# Patient Record
Sex: Female | Born: 1945 | Race: White | Hispanic: No | Marital: Married | Smoking: Never smoker
Health system: Southern US, Community
[De-identification: ages and names within clinical notes are randomized; demographics above are authoritative.]

## PROBLEM LIST (undated history)

## (undated) DIAGNOSIS — C801 Malignant (primary) neoplasm, unspecified: Secondary | ICD-10-CM

## (undated) DIAGNOSIS — R3915 Urgency of urination: Secondary | ICD-10-CM

## (undated) DIAGNOSIS — C50919 Malignant neoplasm of unspecified site of unspecified female breast: Secondary | ICD-10-CM

## (undated) DIAGNOSIS — E7800 Pure hypercholesterolemia, unspecified: Secondary | ICD-10-CM

## (undated) DIAGNOSIS — N939 Abnormal uterine and vaginal bleeding, unspecified: Secondary | ICD-10-CM

## (undated) DIAGNOSIS — I10 Essential (primary) hypertension: Secondary | ICD-10-CM

## (undated) DIAGNOSIS — Z923 Personal history of irradiation: Secondary | ICD-10-CM

## (undated) HISTORY — PX: BREAST CYST ASPIRATION: SHX578

## (undated) HISTORY — PX: TUBAL LIGATION: SHX77

## (undated) HISTORY — PX: DILATION AND CURETTAGE OF UTERUS: SHX78

---

## 2004-10-25 ENCOUNTER — Ambulatory Visit: Payer: Self-pay | Admitting: Internal Medicine

## 2005-06-29 ENCOUNTER — Ambulatory Visit: Payer: Self-pay | Admitting: Unknown Physician Specialty

## 2006-01-25 ENCOUNTER — Ambulatory Visit: Payer: Self-pay | Admitting: Internal Medicine

## 2007-02-28 ENCOUNTER — Ambulatory Visit: Payer: Self-pay | Admitting: Internal Medicine

## 2007-03-07 ENCOUNTER — Ambulatory Visit: Payer: Self-pay | Admitting: Internal Medicine

## 2007-06-26 DIAGNOSIS — C4491 Basal cell carcinoma of skin, unspecified: Secondary | ICD-10-CM

## 2007-06-26 HISTORY — DX: Basal cell carcinoma of skin, unspecified: C44.91

## 2007-09-09 ENCOUNTER — Ambulatory Visit: Payer: Self-pay | Admitting: Internal Medicine

## 2007-09-12 ENCOUNTER — Ambulatory Visit: Payer: Self-pay | Admitting: Internal Medicine

## 2007-09-18 ENCOUNTER — Ambulatory Visit: Payer: Self-pay | Admitting: Internal Medicine

## 2008-04-01 ENCOUNTER — Ambulatory Visit: Payer: Self-pay | Admitting: General Surgery

## 2008-04-08 ENCOUNTER — Ambulatory Visit: Payer: Self-pay | Admitting: General Surgery

## 2008-05-06 ENCOUNTER — Ambulatory Visit: Payer: Self-pay | Admitting: Surgery

## 2008-05-28 ENCOUNTER — Ambulatory Visit: Payer: Self-pay | Admitting: Surgery

## 2008-05-28 ENCOUNTER — Ambulatory Visit: Payer: Self-pay | Admitting: Cardiovascular Disease

## 2008-05-29 ENCOUNTER — Ambulatory Visit: Payer: Self-pay | Admitting: Oncology

## 2008-05-29 DIAGNOSIS — C50919 Malignant neoplasm of unspecified site of unspecified female breast: Secondary | ICD-10-CM

## 2008-05-29 HISTORY — DX: Malignant neoplasm of unspecified site of unspecified female breast: C50.919

## 2008-05-29 HISTORY — PX: BREAST LUMPECTOMY: SHX2

## 2008-05-29 HISTORY — PX: BREAST LUMPECTOMY WITH AXILLARY LYMPH NODE BIOPSY AND MAMMOSITE CAVITY EVALUATION DEVICE: SHX5594

## 2008-06-02 ENCOUNTER — Ambulatory Visit: Payer: Self-pay | Admitting: Surgery

## 2008-06-17 ENCOUNTER — Ambulatory Visit: Payer: Self-pay | Admitting: Oncology

## 2008-06-29 ENCOUNTER — Ambulatory Visit: Payer: Self-pay | Admitting: Oncology

## 2008-07-02 ENCOUNTER — Ambulatory Visit: Payer: Self-pay | Admitting: Surgery

## 2008-07-27 ENCOUNTER — Ambulatory Visit: Payer: Self-pay | Admitting: Oncology

## 2008-08-27 ENCOUNTER — Ambulatory Visit: Payer: Self-pay | Admitting: Oncology

## 2008-09-26 ENCOUNTER — Ambulatory Visit: Payer: Self-pay | Admitting: Oncology

## 2008-09-30 ENCOUNTER — Ambulatory Visit: Payer: Self-pay | Admitting: Oncology

## 2008-10-09 ENCOUNTER — Encounter: Payer: Self-pay | Admitting: Oncology

## 2008-10-27 ENCOUNTER — Encounter: Payer: Self-pay | Admitting: Oncology

## 2008-10-27 ENCOUNTER — Ambulatory Visit: Payer: Self-pay | Admitting: Oncology

## 2008-11-26 ENCOUNTER — Encounter: Payer: Self-pay | Admitting: Oncology

## 2008-11-26 ENCOUNTER — Ambulatory Visit: Payer: Self-pay | Admitting: Radiation Oncology

## 2008-12-07 ENCOUNTER — Ambulatory Visit: Payer: Self-pay | Admitting: Oncology

## 2008-12-27 ENCOUNTER — Ambulatory Visit: Payer: Self-pay | Admitting: Radiation Oncology

## 2009-01-06 ENCOUNTER — Ambulatory Visit: Payer: Self-pay | Admitting: Oncology

## 2009-01-27 ENCOUNTER — Ambulatory Visit: Payer: Self-pay | Admitting: Oncology

## 2009-01-27 ENCOUNTER — Ambulatory Visit: Payer: Self-pay | Admitting: Radiation Oncology

## 2009-03-29 ENCOUNTER — Ambulatory Visit: Payer: Self-pay | Admitting: Oncology

## 2009-04-08 ENCOUNTER — Ambulatory Visit: Payer: Self-pay | Admitting: Oncology

## 2009-04-21 ENCOUNTER — Ambulatory Visit: Payer: Self-pay | Admitting: Surgery

## 2009-04-28 ENCOUNTER — Ambulatory Visit: Payer: Self-pay | Admitting: Oncology

## 2009-07-27 ENCOUNTER — Ambulatory Visit: Payer: Self-pay | Admitting: Oncology

## 2009-08-24 ENCOUNTER — Ambulatory Visit: Payer: Self-pay | Admitting: Oncology

## 2009-08-27 ENCOUNTER — Ambulatory Visit: Payer: Self-pay | Admitting: Oncology

## 2010-04-06 ENCOUNTER — Ambulatory Visit: Payer: Self-pay | Admitting: Oncology

## 2010-04-25 ENCOUNTER — Ambulatory Visit: Payer: Self-pay | Admitting: Surgery

## 2010-04-28 ENCOUNTER — Ambulatory Visit: Payer: Self-pay | Admitting: Oncology

## 2010-05-29 ENCOUNTER — Ambulatory Visit: Payer: Self-pay | Admitting: Oncology

## 2010-10-05 ENCOUNTER — Ambulatory Visit: Payer: Self-pay | Admitting: Oncology

## 2010-10-06 LAB — CANCER ANTIGEN 27.29: CA 27.29: 37.1 U/mL (ref 0.0–38.6)

## 2010-10-28 ENCOUNTER — Ambulatory Visit: Payer: Self-pay | Admitting: Oncology

## 2011-04-28 ENCOUNTER — Ambulatory Visit: Payer: Self-pay | Admitting: Oncology

## 2011-04-29 ENCOUNTER — Ambulatory Visit: Payer: Self-pay | Admitting: Oncology

## 2011-04-29 LAB — CANCER ANTIGEN 27.29: CA 27.29: 25.9 U/mL (ref 0.0–38.6)

## 2011-05-03 ENCOUNTER — Ambulatory Visit: Payer: Self-pay | Admitting: Surgery

## 2011-11-17 ENCOUNTER — Ambulatory Visit: Payer: Self-pay | Admitting: Oncology

## 2011-11-17 LAB — COMPREHENSIVE METABOLIC PANEL
Albumin: 3.9 g/dL (ref 3.4–5.0)
Anion Gap: 8 (ref 7–16)
Bilirubin,Total: 0.4 mg/dL (ref 0.2–1.0)
Calcium, Total: 10.3 mg/dL — ABNORMAL HIGH (ref 8.5–10.1)
Chloride: 104 mmol/L (ref 98–107)
Co2: 29 mmol/L (ref 21–32)
Creatinine: 0.78 mg/dL (ref 0.60–1.30)
EGFR (African American): >60
Glucose: 96 mg/dL (ref 65–99)
SGPT (ALT): 31 U/L

## 2011-11-17 LAB — CBC CANCER CENTER
Basophil #: 0 x10 3/mm (ref 0.0–0.1)
Basophil %: 0.8 %
HGB: 13.1 g/dL (ref 12.0–16.0)
MCHC: 34 g/dL (ref 32.0–36.0)
MCV: 90 fL (ref 80–100)
Monocyte #: 0.5 x10 3/mm (ref 0.2–0.9)
Monocyte %: 9.8 %
Neutrophil %: 47.6 %
Platelet: 229 x10 3/mm (ref 150–440)
RBC: 4.27 10*6/uL (ref 3.80–5.20)
WBC: 5.5 x10 3/mm (ref 3.6–11.0)

## 2011-11-18 LAB — CANCER ANTIGEN 27.29: CA 27.29: 31.6 U/mL (ref 0.0–38.6)

## 2011-11-27 ENCOUNTER — Ambulatory Visit: Payer: Self-pay | Admitting: Oncology

## 2012-05-16 ENCOUNTER — Ambulatory Visit: Payer: Self-pay | Admitting: Surgery

## 2012-05-17 ENCOUNTER — Ambulatory Visit: Payer: Self-pay | Admitting: Oncology

## 2012-05-17 LAB — CBC CANCER CENTER
Basophil %: 0.6 %
Eosinophil %: 2.5 %
Lymphocyte #: 1.8 x10 3/mm (ref 1.0–3.6)
Lymphocyte %: 33.2 %
MCH: 29.8 pg (ref 26.0–34.0)
MCV: 88 fL (ref 80–100)
Monocyte #: 0.5 x10 3/mm (ref 0.2–0.9)
Monocyte %: 10 %
Neutrophil #: 3 x10 3/mm (ref 1.4–6.5)
Platelet: 231 x10 3/mm (ref 150–440)
RBC: 4.28 10*6/uL (ref 3.80–5.20)
WBC: 5.5 x10 3/mm (ref 3.6–11.0)

## 2012-05-17 LAB — COMPREHENSIVE METABOLIC PANEL
Albumin: 3.8 g/dL (ref 3.4–5.0)
Alkaline Phosphatase: 72 U/L (ref 50–136)
Anion Gap: 8 (ref 7–16)
Bilirubin,Total: 0.4 mg/dL (ref 0.2–1.0)
Calcium, Total: 10 mg/dL (ref 8.5–10.1)
Co2: 32 mmol/L (ref 21–32)
Glucose: 95 mg/dL (ref 65–99)
Osmolality: 287 (ref 275–301)
SGOT(AST): 29 U/L (ref 15–37)
SGPT (ALT): 33 U/L (ref 12–78)
Sodium: 144 mmol/L (ref 136–145)

## 2012-05-20 LAB — CANCER ANTIGEN 27.29: CA 27.29: 33.8 U/mL (ref 0.0–38.6)

## 2012-05-28 ENCOUNTER — Ambulatory Visit: Payer: Self-pay | Admitting: Surgery

## 2012-05-29 ENCOUNTER — Ambulatory Visit: Payer: Self-pay | Admitting: Oncology

## 2012-12-20 ENCOUNTER — Ambulatory Visit: Payer: Self-pay | Admitting: Oncology

## 2012-12-20 LAB — COMPREHENSIVE METABOLIC PANEL
Albumin: 4 g/dL (ref 3.4–5.0)
Anion Gap: 8 (ref 7–16)
Bilirubin,Total: 0.4 mg/dL (ref 0.2–1.0)
Chloride: 106 mmol/L (ref 98–107)
EGFR (Non-African Amer.): >60
Glucose: 92 mg/dL (ref 65–99)
Osmolality: 288 (ref 275–301)
SGOT(AST): 27 U/L (ref 15–37)
Sodium: 145 mmol/L (ref 136–145)

## 2012-12-20 LAB — CBC CANCER CENTER
Basophil #: 0 x10 3/mm (ref 0.0–0.1)
Basophil %: 0.7 %
HGB: 12.9 g/dL (ref 12.0–16.0)
Lymphocyte #: 2.1 x10 3/mm (ref 1.0–3.6)
Lymphocyte %: 39.1 %
MCH: 30.5 pg (ref 26.0–34.0)
MCHC: 34.4 g/dL (ref 32.0–36.0)
MCV: 88 fL (ref 80–100)
Monocyte #: 0.5 x10 3/mm (ref 0.2–0.9)
Neutrophil #: 2.6 x10 3/mm (ref 1.4–6.5)
Neutrophil %: 47.9 %
RBC: 4.25 10*6/uL (ref 3.80–5.20)
WBC: 5.5 x10 3/mm (ref 3.6–11.0)

## 2012-12-21 LAB — CANCER ANTIGEN 27.29: CA 27.29: 33.5 U/mL (ref 0.0–38.6)

## 2012-12-27 ENCOUNTER — Ambulatory Visit: Payer: Self-pay | Admitting: Oncology

## 2013-05-01 ENCOUNTER — Ambulatory Visit: Payer: Self-pay | Admitting: Oncology

## 2013-05-27 ENCOUNTER — Ambulatory Visit: Payer: Self-pay | Admitting: Internal Medicine

## 2013-07-25 ENCOUNTER — Ambulatory Visit: Payer: Self-pay | Admitting: Oncology

## 2013-07-25 LAB — CBC CANCER CENTER
Basophil #: 0 x10 3/mm (ref 0.0–0.1)
Basophil %: 0.7 %
EOS PCT: 2.1 %
Eosinophil #: 0.1 x10 3/mm (ref 0.0–0.7)
HCT: 40.4 % (ref 35.0–47.0)
HGB: 13.4 g/dL (ref 12.0–16.0)
LYMPHS ABS: 2 x10 3/mm (ref 1.0–3.6)
Lymphocyte %: 38.3 %
MCH: 29.5 pg (ref 26.0–34.0)
MCHC: 33.2 g/dL (ref 32.0–36.0)
MCV: 89 fL (ref 80–100)
MONOS PCT: 8 %
Monocyte #: 0.4 x10 3/mm (ref 0.2–0.9)
NEUTROS PCT: 50.9 %
Neutrophil #: 2.7 x10 3/mm (ref 1.4–6.5)
Platelet: 251 x10 3/mm (ref 150–440)
RBC: 4.56 10*6/uL (ref 3.80–5.20)
RDW: 13.8 % (ref 11.5–14.5)
WBC: 5.3 x10 3/mm (ref 3.6–11.0)

## 2013-07-25 LAB — COMPREHENSIVE METABOLIC PANEL
ALBUMIN: 3.8 g/dL (ref 3.4–5.0)
ALK PHOS: 87 U/L
AST: 22 U/L (ref 15–37)
Anion Gap: 10 (ref 7–16)
BILIRUBIN TOTAL: 0.3 mg/dL (ref 0.2–1.0)
BUN: 11 mg/dL (ref 7–18)
CO2: 30 mmol/L (ref 21–32)
Calcium, Total: 9.6 mg/dL (ref 8.5–10.1)
Chloride: 104 mmol/L (ref 98–107)
Creatinine: 0.78 mg/dL (ref 0.60–1.30)
EGFR (African American): >60
EGFR (Non-African Amer.): >60
Glucose: 97 mg/dL (ref 65–99)
OSMOLALITY: 286 (ref 275–301)
POTASSIUM: 4 mmol/L (ref 3.5–5.1)
SGPT (ALT): 30 U/L (ref 12–78)
Sodium: 144 mmol/L (ref 136–145)
Total Protein: 7.8 g/dL (ref 6.4–8.2)

## 2013-07-26 LAB — CANCER ANTIGEN 27.29: CA 27.29: 33.8 U/mL (ref 0.0–38.6)

## 2013-07-27 ENCOUNTER — Ambulatory Visit: Payer: Self-pay | Admitting: Oncology

## 2014-06-02 ENCOUNTER — Ambulatory Visit: Payer: Self-pay | Admitting: Oncology

## 2014-07-24 ENCOUNTER — Ambulatory Visit: Payer: Self-pay | Admitting: Oncology

## 2014-07-28 ENCOUNTER — Ambulatory Visit
Admit: 2014-07-28 | Disposition: A | Discharge status: Home / Self Care | Payer: Self-pay | Attending: Oncology | Admitting: Oncology

## 2015-06-23 ENCOUNTER — Other Ambulatory Visit: Payer: Self-pay | Admitting: Internal Medicine

## 2015-06-23 DIAGNOSIS — R319 Hematuria, unspecified: Secondary | ICD-10-CM

## 2015-06-23 DIAGNOSIS — Z853 Personal history of malignant neoplasm of breast: Secondary | ICD-10-CM

## 2015-06-23 DIAGNOSIS — Z1231 Encounter for screening mammogram for malignant neoplasm of breast: Secondary | ICD-10-CM

## 2015-06-29 ENCOUNTER — Ambulatory Visit
Admission: RE | Admit: 2015-06-29 | Discharge: 2015-06-29 | Disposition: A | Discharge status: Home / Self Care | Payer: Medicare Other | Source: Ambulatory Visit | Attending: Internal Medicine | Admitting: Internal Medicine

## 2015-06-29 ENCOUNTER — Ambulatory Visit: Payer: Self-pay

## 2015-06-29 DIAGNOSIS — D259 Leiomyoma of uterus, unspecified: Secondary | ICD-10-CM | POA: Diagnosis not present

## 2015-06-29 DIAGNOSIS — R319 Hematuria, unspecified: Secondary | ICD-10-CM | POA: Diagnosis present

## 2015-06-29 DIAGNOSIS — K8020 Calculus of gallbladder without cholecystitis without obstruction: Secondary | ICD-10-CM | POA: Diagnosis not present

## 2015-06-29 DIAGNOSIS — K5730 Diverticulosis of large intestine without perforation or abscess without bleeding: Secondary | ICD-10-CM | POA: Insufficient documentation

## 2015-06-29 HISTORY — DX: Essential (primary) hypertension: I10

## 2015-06-29 HISTORY — DX: Malignant (primary) neoplasm, unspecified: C80.1

## 2015-06-29 MED ORDER — IOHEXOL 300 MG/ML  SOLN
100.0000 mL | Freq: Once | INTRAMUSCULAR | Status: AC | PRN
  Administered 2015-06-29: 100 mL via INTRAVENOUS

## 2015-07-28 ENCOUNTER — Ambulatory Visit
Admission: RE | Admit: 2015-07-28 | Discharge: 2015-07-28 | Disposition: A | Discharge status: Home / Self Care | Payer: Medicare Other | Source: Ambulatory Visit | Attending: Internal Medicine | Admitting: Internal Medicine

## 2015-07-28 ENCOUNTER — Other Ambulatory Visit: Payer: Self-pay | Admitting: Internal Medicine

## 2015-07-28 DIAGNOSIS — Z853 Personal history of malignant neoplasm of breast: Secondary | ICD-10-CM

## 2015-07-28 DIAGNOSIS — Z1231 Encounter for screening mammogram for malignant neoplasm of breast: Secondary | ICD-10-CM

## 2015-07-28 HISTORY — DX: Malignant neoplasm of unspecified site of unspecified female breast: C50.919

## 2015-08-03 ENCOUNTER — Other Ambulatory Visit: Payer: Self-pay | Admitting: *Deleted

## 2015-08-03 DIAGNOSIS — C50919 Malignant neoplasm of unspecified site of unspecified female breast: Secondary | ICD-10-CM

## 2015-08-05 ENCOUNTER — Other Ambulatory Visit: Payer: Self-pay

## 2015-08-05 ENCOUNTER — Ambulatory Visit: Payer: Self-pay

## 2015-08-06 ENCOUNTER — Inpatient Hospital Stay: Payer: Medicare Other | Attending: Oncology

## 2015-08-06 ENCOUNTER — Ambulatory Visit (HOSPITAL_BASED_OUTPATIENT_CLINIC_OR_DEPARTMENT_OTHER): Payer: Medicare Other | Admitting: Oncology

## 2015-08-06 VITALS — BP 138/58 | HR 69 | Temp 98.4°F | Ht 62.0 in | Wt 181.2 lb

## 2015-08-06 DIAGNOSIS — C801 Malignant (primary) neoplasm, unspecified: Secondary | ICD-10-CM | POA: Insufficient documentation

## 2015-08-06 DIAGNOSIS — Z170 Estrogen receptor positive status [ER+]: Secondary | ICD-10-CM | POA: Insufficient documentation

## 2015-08-06 DIAGNOSIS — C50919 Malignant neoplasm of unspecified site of unspecified female breast: Secondary | ICD-10-CM

## 2015-08-06 DIAGNOSIS — Z923 Personal history of irradiation: Secondary | ICD-10-CM

## 2015-08-06 DIAGNOSIS — M1990 Unspecified osteoarthritis, unspecified site: Secondary | ICD-10-CM | POA: Insufficient documentation

## 2015-08-06 DIAGNOSIS — I10 Essential (primary) hypertension: Secondary | ICD-10-CM | POA: Insufficient documentation

## 2015-08-06 DIAGNOSIS — Z853 Personal history of malignant neoplasm of breast: Secondary | ICD-10-CM

## 2015-08-06 DIAGNOSIS — E785 Hyperlipidemia, unspecified: Secondary | ICD-10-CM | POA: Insufficient documentation

## 2015-08-06 LAB — CBC
HCT: 40.8 % (ref 35.0–47.0)
Hemoglobin: 13.6 g/dL (ref 12.0–16.0)
MCH: 29.4 pg (ref 26.0–34.0)
MCHC: 33.4 g/dL (ref 32.0–36.0)
MCV: 88.1 fL (ref 80.0–100.0)
PLATELETS: 225 10*3/uL (ref 150–440)
RBC: 4.63 MIL/uL (ref 3.80–5.20)
RDW: 13.9 % (ref 11.5–14.5)
WBC: 6.2 10*3/uL (ref 3.6–11.0)

## 2015-08-06 NOTE — Progress Notes (Signed)
Patient here for follow up visit, recently had colonoscopy with Dr. Tiffany Kocher, had mammogram, and recent CT scan.  Has chronic pain in right breast in scar location.

## 2015-08-06 NOTE — Progress Notes (Signed)
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  Oncotype score of 22 (~14% chance of recurrent disease over 10 years.)   INTERVAL HISTORY: Patient returns to clinic today for routine yearly follow-up. She was recently admitted to the hospital for GI bleed, but CT scans and colonoscopies did not reveal an etiology. She is currently back to her baseline. She currently feels well and is asymptomatic. She completed 5 years of Arimidex. She has no neurologic complaints. She denies any recent fevers or illnesses. She has a good appetite and denies weight loss. She has no chest pain or shortness of breath. She denies any nausea, vomiting, constipation, or diarrhea. She has no urinary complaints. Patient offers no specific complaints today.   REVIEW OF SYSTEMS:   Review of Systems  Constitutional: Negative.  Negative for fever, weight loss and malaise/fatigue.  Respiratory: Negative.  Negative for cough and shortness of breath.   Cardiovascular: Negative.  Negative for chest pain.  Gastrointestinal: Negative.  Negative for abdominal pain, blood in stool and melena.  Genitourinary: Negative.   Musculoskeletal: Negative.   Neurological: Negative.  Negative for weakness.    As per HPI. Otherwise, a complete review of systems is negatve.  PAST MEDICAL HISTORY: Past Medical History  Diagnosis Date  . Hypertension   . Cancer [facility])     Right breast cancer 2009 with lumpectomy and rad tx  . Breast cancer ([place]) 2010    lumpectomy with Radiation    PAST SURGICAL HISTORY: Past Surgical History  Procedure Laterality Date  . Breast lumpectomy with axillary lymph node biopsy and mammosite cavity evaluation device Right 2010    FAMILY  HISTORY Family History  Problem Relation Age of Onset  . Breast cancer Neg Hx        ADVANCED DIRECTIVES:    HEALTH MAINTENANCE: Social History  Substance Use Topics  . Smoking status: Not on file  . Smokeless tobacco: Not on file  . Alcohol Use: Not on file     Colonoscopy:  PAP:  Bone density:  Lipid panel:  Allergies  Allergen Reactions  . Sulfa Antibiotics     Pt states allergic reaction occurred as a child. Pt does not remember reaction.    Current Outpatient Prescriptions  Medication Sig Dispense Refill  . acetaminophen (TYLENOL) 325 MG tablet Take 650 mg by mouth every 6 (six) hours as needed (as needed).    . Calcium Carbonate-Vitamin D (CALCIUM-VITAMIN D) 500-200 MG-UNIT tablet Take by mouth.    Marland Kitchen lisinopril (PRINIVIL,ZESTRIL) 40 MG tablet Take by mouth.    . lovastatin (MEVACOR) 10 MG tablet Take by mouth.    . Multiple Vitamin (MULTI-VITAMINS) TABS Take by mouth.     No current facility-administered medications for this visit.    OBJECTIVE: Filed Vitals:   08/06/15 0940  BP: 138/58  Pulse: 69  Temp: 98.4 F (36.9 C)     Body mass index is 33.14 kg/(m^2).    ECOG FS:0 - Asymptomatic  General: Well-developed, well-nourished, no acute distress. Eyes: Pink conjunctiva, anicteric sclera. Breasts: Bilateral breasts and axilla without lumps or masses. Lungs: Clear to auscultation bilaterally. Heart: Regular rate and rhythm. No rubs, murmurs, or gallops. Abdomen: Soft, nontender, nondistended. No organomegaly noted, normoactive bowel sounds. Musculoskeletal: No edema, cyanosis, or clubbing.  Neuro: Alert, answering all questions appropriately. Cranial nerves grossly intact. Skin: No rashes or petechiae noted. Psych: Normal affect.   LAB RESULTS:  Lab Results  Component Value Date   NA 144 07/25/2013   K 4.0 07/25/2013   CL 104 07/25/2013   CO2 30 07/25/2013   GLUCOSE 97 07/25/2013   BUN 11 07/25/2013   CREATININE 0.78 07/25/2013   CALCIUM 9.6  07/25/2013   PROT 7.8 07/25/2013   ALBUMIN 3.8 07/25/2013   AST 22 07/25/2013   ALT 30 07/25/2013   ALKPHOS 87 07/25/2013   BILITOT 0.3 07/25/2013   GFRNONAA >60 07/25/2013   GFRAA >60 07/25/2013    Lab Results  Component Value Date   WBC 6.2 08/06/2015   NEUTROABS 2.7 07/25/2013   HGB 13.6 08/06/2015   HCT 40.8 08/06/2015   MCV 88.1 08/06/2015   PLT 225 08/06/2015     STUDIES: Mm Diag Breast Tomo Bilateral  07/28/2015  CLINICAL DATA:  History of right breast cancer status post lumpectomy in 2010. EXAM: DIGITAL DIAGNOSTIC BILATERAL MAMMOGRAM WITH 3D TOMOSYNTHESIS AND CAD COMPARISON:  Previous exam(s). ACR Breast Density Category b: There are scattered areas of fibroglandular density. FINDINGS: Lumpectomy changes in the right breast appear stable. No new suspicious mass or malignant type microcalcifications identified in either breast. Mammographic images were processed with CAD. IMPRESSION: No evidence of malignancy in either breast. RECOMMENDATION: Bilateral screening mammogram in 1 year is recommended. I have discussed the findings and recommendations with the patient. Results were also provided in writing at the conclusion of the visit. If applicable, a reminder letter will be sent to the patient regarding the next appointment. BI-RADS CATEGORY  2: Benign. Electronically Signed   By: Lillia Mountain M.D.   On: 07/28/2015 10:19    ASSESSMENT: Pathologic ER/PR stage Ia adenocarcinoma the breast.  PLAN:    1. Breast cancer: Patient did not require adjuvant chemotherapy given her Oncotype score. She had MammoSite radiation in February 2010.  She completed 5 years of Arimidex in 2015.  No intervention is needed at this time. Her most recent mammogram on July 28, 2015 was reported as BI-RADS 2. Repeat in one year. Since patient is greater than 7 years out from completing her treatments, she can be discharged from clinic. She expressed understanding that she still requires yearly mammograms and  is at increased risk of recurrence over the general population. Mammograms can now be ordered by her primary care physician.  Patient expressed understanding and was in agreement with this plan. She also understands that She can call clinic at any time with any questions, concerns, or complaints.    Lloyd Huger, MD   08/06/2015 10:08 AM

## 2015-08-07 LAB — CA 27.29 (SERIAL MONITOR): CA 27.29: 28.3 U/mL (ref 0.0–38.6)

## 2016-07-28 ENCOUNTER — Other Ambulatory Visit: Payer: Self-pay | Admitting: Internal Medicine

## 2016-07-28 DIAGNOSIS — Z1231 Encounter for screening mammogram for malignant neoplasm of breast: Secondary | ICD-10-CM

## 2016-08-30 ENCOUNTER — Ambulatory Visit
Admission: RE | Admit: 2016-08-30 | Discharge: 2016-08-30 | Disposition: A | Discharge status: Home / Self Care | Payer: Medicare Other | Source: Ambulatory Visit | Attending: Internal Medicine | Admitting: Internal Medicine

## 2016-08-30 DIAGNOSIS — Z1231 Encounter for screening mammogram for malignant neoplasm of breast: Secondary | ICD-10-CM

## 2016-09-01 ENCOUNTER — Other Ambulatory Visit: Payer: Self-pay | Admitting: Internal Medicine

## 2016-09-01 DIAGNOSIS — N6310 Unspecified lump in the right breast, unspecified quadrant: Secondary | ICD-10-CM

## 2016-09-01 DIAGNOSIS — N644 Mastodynia: Secondary | ICD-10-CM

## 2016-09-01 DIAGNOSIS — R928 Other abnormal and inconclusive findings on diagnostic imaging of breast: Secondary | ICD-10-CM

## 2016-09-13 ENCOUNTER — Ambulatory Visit
Admission: RE | Admit: 2016-09-13 | Discharge: 2016-09-13 | Disposition: A | Discharge status: Home / Self Care | Payer: Medicare Other | Source: Ambulatory Visit | Attending: Internal Medicine | Admitting: Internal Medicine

## 2016-09-13 DIAGNOSIS — Z9889 Other specified postprocedural states: Secondary | ICD-10-CM | POA: Insufficient documentation

## 2016-09-13 DIAGNOSIS — N6310 Unspecified lump in the right breast, unspecified quadrant: Secondary | ICD-10-CM

## 2016-09-13 DIAGNOSIS — R928 Other abnormal and inconclusive findings on diagnostic imaging of breast: Secondary | ICD-10-CM

## 2016-09-13 DIAGNOSIS — N644 Mastodynia: Secondary | ICD-10-CM | POA: Insufficient documentation

## 2017-06-25 ENCOUNTER — Other Ambulatory Visit: Payer: Self-pay | Admitting: Internal Medicine

## 2017-06-25 DIAGNOSIS — Z1231 Encounter for screening mammogram for malignant neoplasm of breast: Secondary | ICD-10-CM

## 2017-08-01 DIAGNOSIS — D239 Other benign neoplasm of skin, unspecified: Secondary | ICD-10-CM

## 2017-08-01 HISTORY — DX: Other benign neoplasm of skin, unspecified: D23.9

## 2017-09-25 ENCOUNTER — Ambulatory Visit
Admission: RE | Admit: 2017-09-25 | Discharge: 2017-09-25 | Disposition: A | Discharge status: Home / Self Care | Payer: Medicare Other | Source: Ambulatory Visit | Attending: Internal Medicine | Admitting: Internal Medicine

## 2017-09-25 DIAGNOSIS — Z1231 Encounter for screening mammogram for malignant neoplasm of breast: Secondary | ICD-10-CM | POA: Insufficient documentation

## 2017-09-25 HISTORY — DX: Personal history of irradiation: Z92.3

## 2017-10-27 IMAGING — CT CT ABD-PELV W/ CM
2 of 5 series · 17 of 46 positions shown, 19 images · IV contrast (omnipaque)
Comparison: None.

CLINICAL DATA: Hematuria versus bloody vaginal discharge on
04/18/2015. Initial encounter.

EXAM:
CT ABDOMEN AND PELVIS WITH CONTRAST
TECHNIQUE: Multidetector CT imaging of the abdomen and pelvis was performed
using the standard protocol following bolus administration of
intravenous contrast.
CONTRAST:  100 mL OMNIPAQUE IOHEXOL 300 MG/ML  SOLN

[Series 2: axial soft tissue · axial · 0.68mm/px · z∈[-765,-375]mm · 14 of 88 slices shown, 16 images]
[im 5/88  soft-tissue]
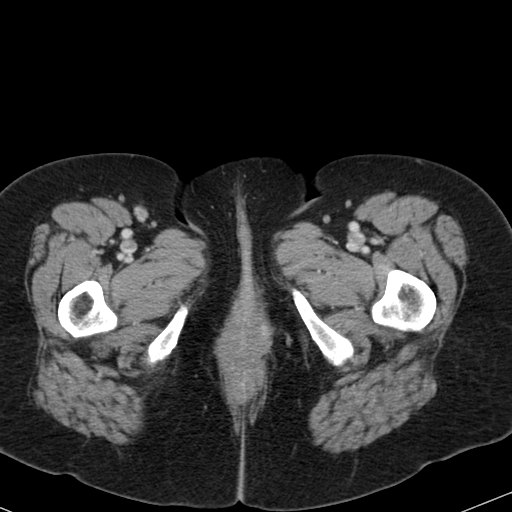
[im 5/88  bone]
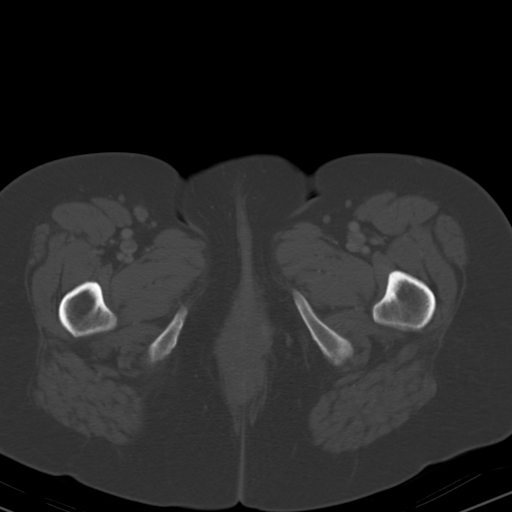
[im 10/88  soft-tissue]
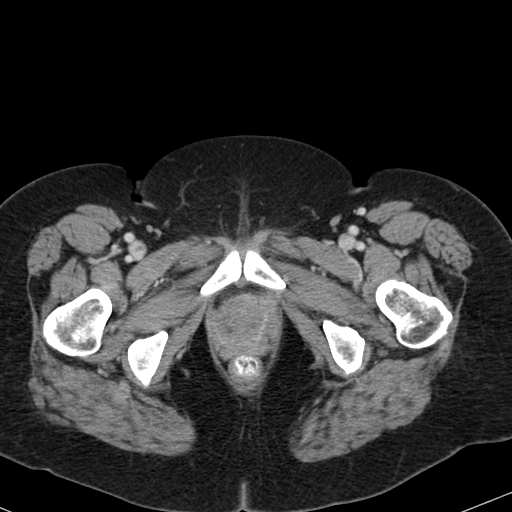
[im 20/88  soft-tissue]
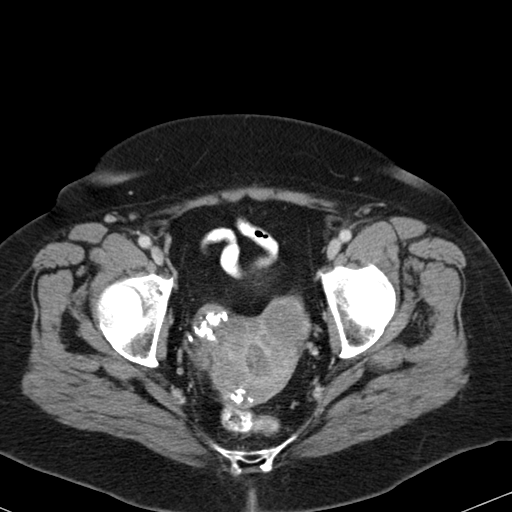
[im 25/88  soft-tissue]
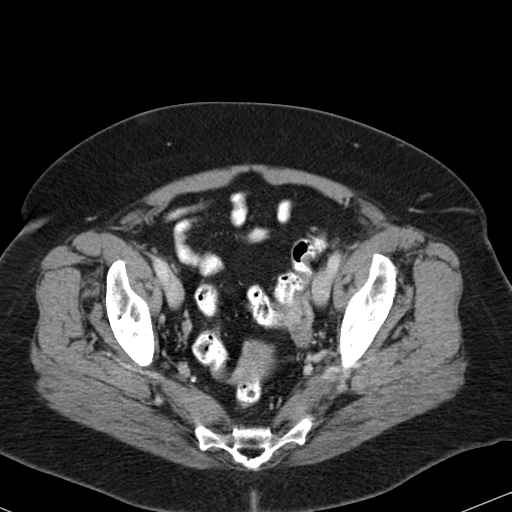
[im 30/88  soft-tissue]
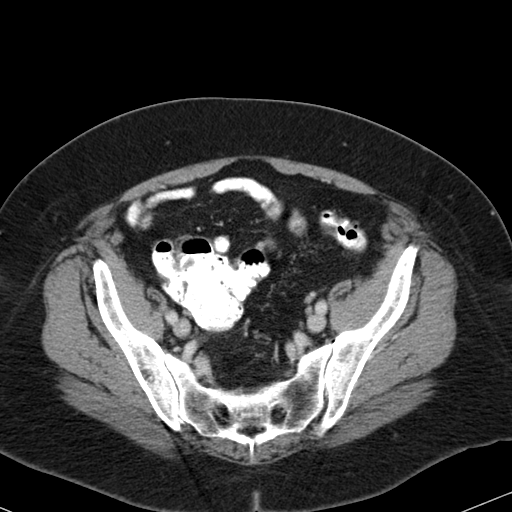
[im 34/88  soft-tissue]
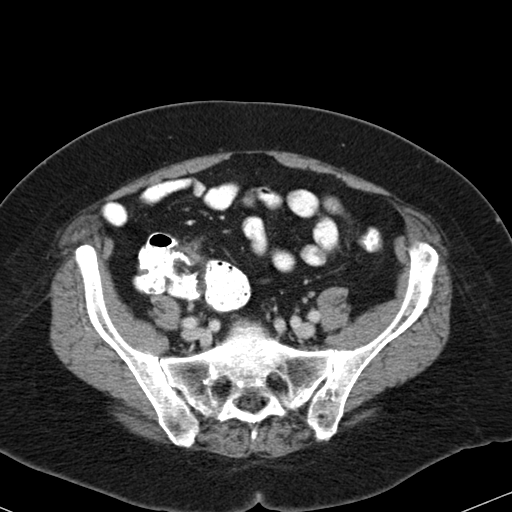
[im 39/88  soft-tissue]
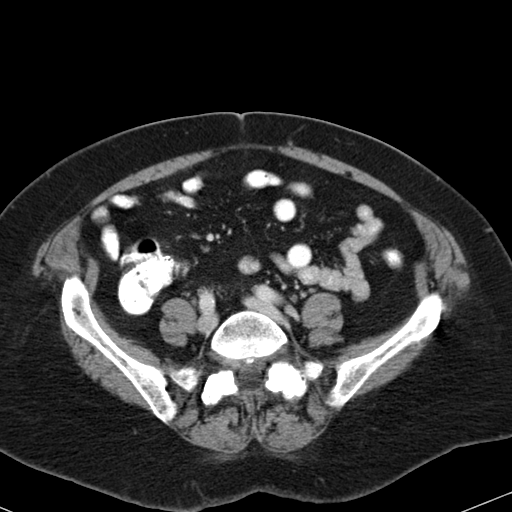
[im 49/88  soft-tissue]
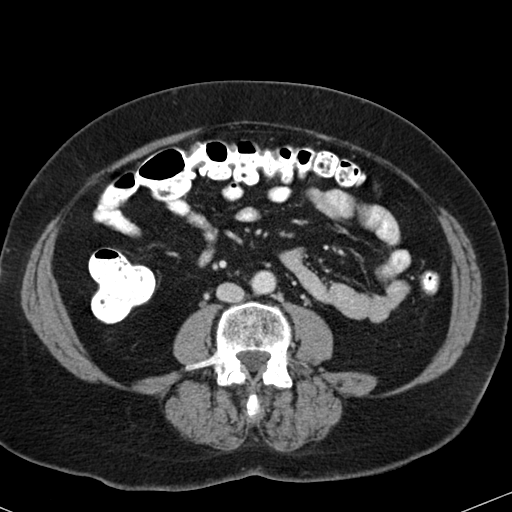
[im 54/88  soft-tissue]
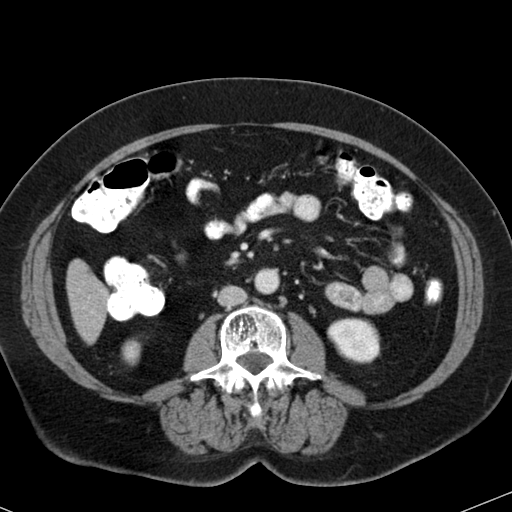
[im 54/88  bone]
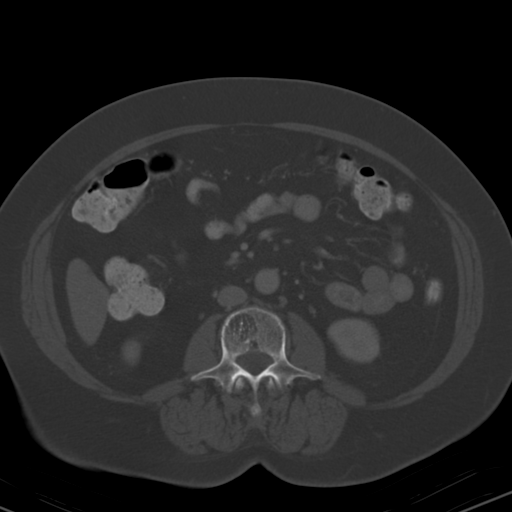
[im 59/88  soft-tissue]
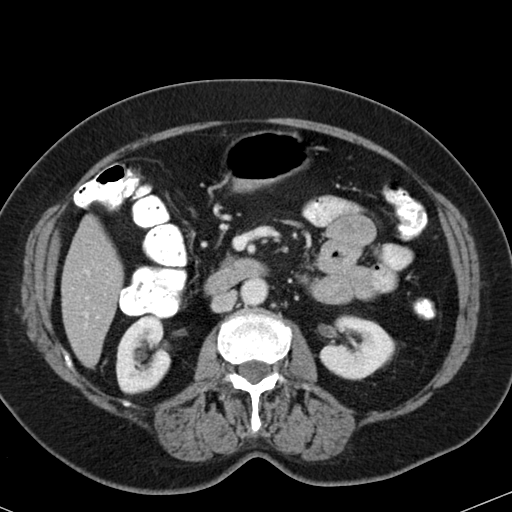
[im 63/88  soft-tissue]
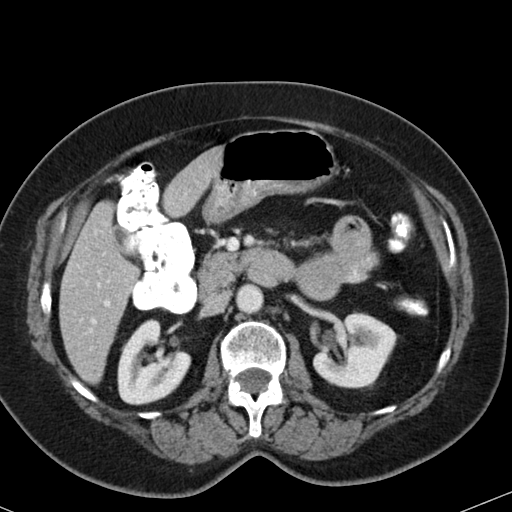
[im 68/88  soft-tissue]
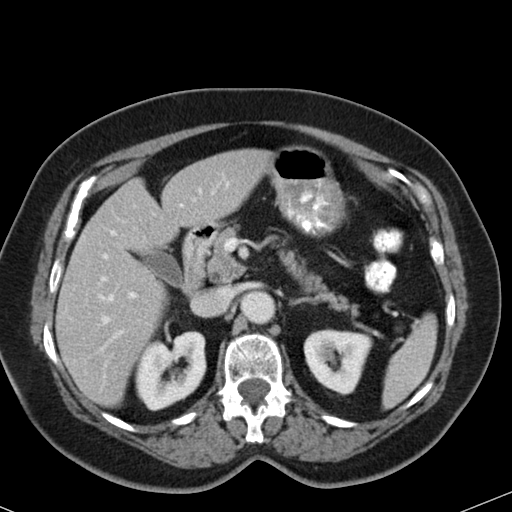
[im 78/88  soft-tissue]
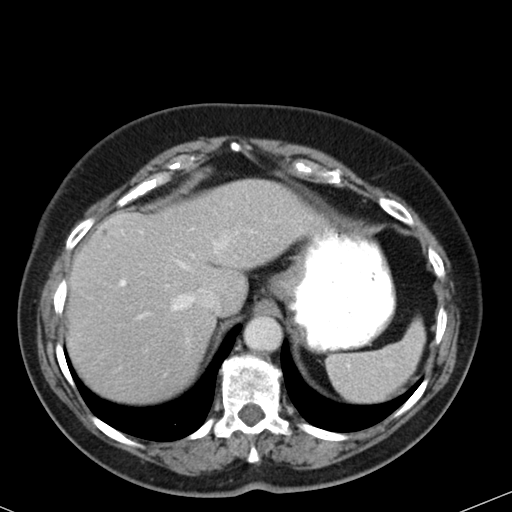
[im 83/88  soft-tissue]
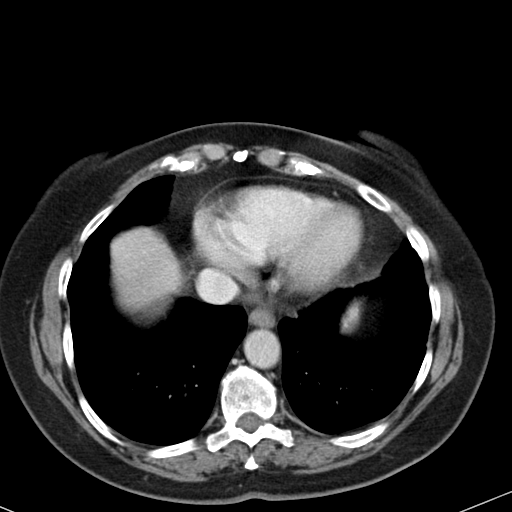

[Series 602: coronal · coronal · 0.85mm/px · 3 of 118 slices shown]
[im 40/118  soft-tissue]
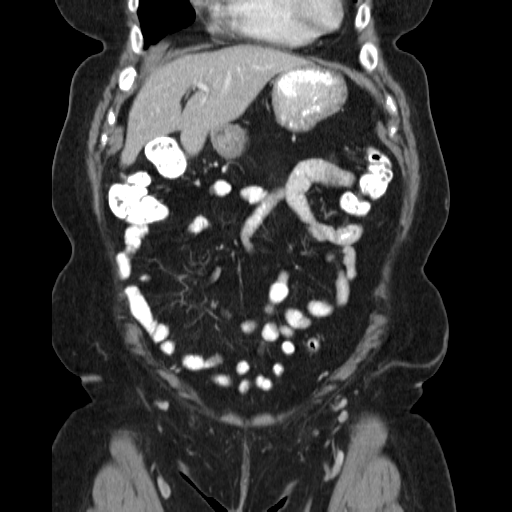
[im 53/118  soft-tissue]
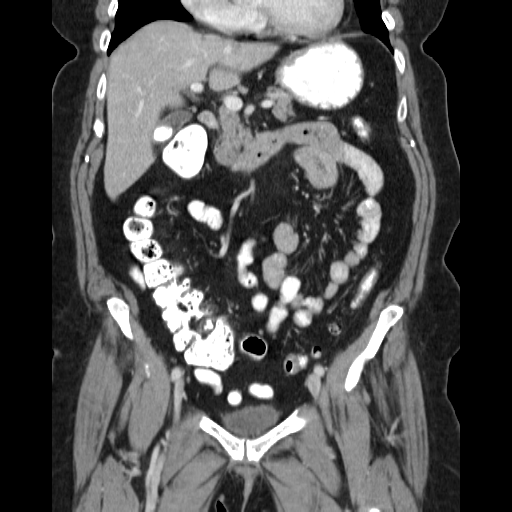
[im 66/118  soft-tissue]
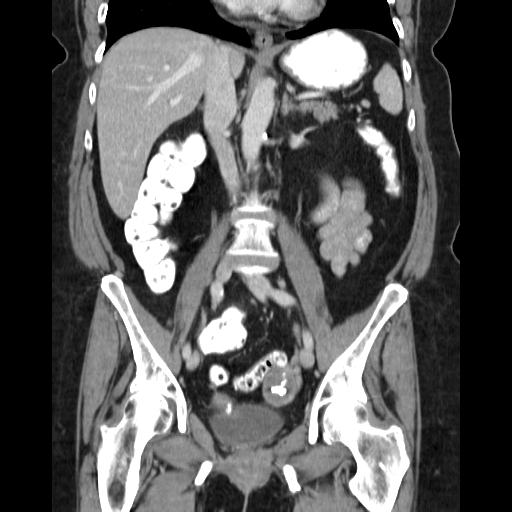

[17 of 46 positions shown; findings below may reference images not displayed]

FINDINGS: The lung bases are clear. No pleural or pericardial effusion. Heart
size is normal.

A few scattered low attenuating lesions in the liver most consistent
with cysts. The liver is otherwise unremarkable. A single stone in
the gallbladder measures 2.4 cm in diameter. There is no CT evidence
of cholecystitis. The adrenal glands, spleen, pancreas and biliary
tree are unremarkable. Small parapelvic renal cysts are noted. The
kidneys are otherwise unremarkable. Ureters and urinary bladder
appear normal.

The patient has a fibroid uterus with several of the left fibroids
demonstrating degeneration with coarse calcifications present.
Adnexa are unremarkable. Mild sigmoid diverticulosis without
diverticulitis is identified. The colon otherwise appears normal.
The appendix is not visualized but no evidence inflammatory process
is seen. The stomach and small bowel are unremarkable. No
lymphadenopathy or fluid.

Small sclerotic lesion in the left ilium with spiculated margins is
most consistent with a benign bone island. No worrisome bony lesion
is seen. There is some facet degenerative disease in the lower
lumbar spine.
IMPRESSION: No acute abnormality or finding to explain the patient's symptoms.

Single gallstone without evidence of cholecystitis.

Fibroid uterus.

Mild sigmoid diverticulosis without diverticulitis.

## 2019-01-14 ENCOUNTER — Other Ambulatory Visit: Payer: Self-pay | Admitting: Internal Medicine

## 2019-01-14 DIAGNOSIS — Z1231 Encounter for screening mammogram for malignant neoplasm of breast: Secondary | ICD-10-CM

## 2019-01-16 ENCOUNTER — Other Ambulatory Visit: Payer: Self-pay | Admitting: Internal Medicine

## 2019-01-16 ENCOUNTER — Ambulatory Visit
Admission: RE | Admit: 2019-01-16 | Discharge: 2019-01-16 | Disposition: A | Discharge status: Home / Self Care | Payer: Medicare Other | Source: Ambulatory Visit | Attending: Internal Medicine | Admitting: Internal Medicine

## 2019-01-16 ENCOUNTER — Encounter (INDEPENDENT_AMBULATORY_CARE_PROVIDER_SITE_OTHER): Payer: Self-pay

## 2019-01-16 ENCOUNTER — Other Ambulatory Visit: Payer: Self-pay

## 2019-01-16 DIAGNOSIS — Z1231 Encounter for screening mammogram for malignant neoplasm of breast: Secondary | ICD-10-CM | POA: Diagnosis present

## 2019-01-16 DIAGNOSIS — N950 Postmenopausal bleeding: Secondary | ICD-10-CM

## 2019-01-23 ENCOUNTER — Other Ambulatory Visit: Payer: Self-pay

## 2019-01-23 ENCOUNTER — Ambulatory Visit
Admission: RE | Admit: 2019-01-23 | Discharge: 2019-01-23 | Disposition: A | Discharge status: Home / Self Care | Payer: Medicare Other | Source: Ambulatory Visit | Attending: Internal Medicine | Admitting: Internal Medicine

## 2019-01-23 DIAGNOSIS — K8020 Calculus of gallbladder without cholecystitis without obstruction: Secondary | ICD-10-CM | POA: Insufficient documentation

## 2019-01-23 DIAGNOSIS — I700 Atherosclerosis of aorta: Secondary | ICD-10-CM | POA: Diagnosis not present

## 2019-01-23 DIAGNOSIS — D259 Leiomyoma of uterus, unspecified: Secondary | ICD-10-CM | POA: Insufficient documentation

## 2019-01-23 DIAGNOSIS — N950 Postmenopausal bleeding: Secondary | ICD-10-CM | POA: Diagnosis not present

## 2019-01-23 MED ORDER — IOHEXOL 300 MG/ML  SOLN
100.0000 mL | Freq: Once | INTRAMUSCULAR | Status: AC | PRN
  Administered 2019-01-23: 10:00:00 100 mL via INTRAVENOUS

## 2019-03-28 ENCOUNTER — Other Ambulatory Visit
Admission: RE | Admit: 2019-03-28 | Discharge: 2019-03-28 | Disposition: A | Discharge status: Home / Self Care | Payer: Medicare Other | Source: Ambulatory Visit | Attending: Obstetrics & Gynecology | Admitting: Obstetrics & Gynecology

## 2019-03-28 ENCOUNTER — Other Ambulatory Visit: Payer: Self-pay

## 2019-03-28 DIAGNOSIS — Z01818 Encounter for other preprocedural examination: Secondary | ICD-10-CM | POA: Insufficient documentation

## 2019-03-28 HISTORY — DX: Abnormal uterine and vaginal bleeding, unspecified: N93.9

## 2019-03-28 HISTORY — DX: Urgency of urination: R39.15

## 2019-03-28 HISTORY — DX: Pure hypercholesterolemia, unspecified: E78.00

## 2019-03-28 LAB — TYPE AND SCREEN
ABO/RH(D): O POS
Antibody Screen: NEGATIVE

## 2019-03-28 NOTE — Patient Instructions (Addendum)
Your procedure is scheduled on: 04/04/2019 Fri Report to Same Day Surgery 2nd floor medical mall [facility] Entrance-take elevator on left to 2nd floor.  Check in with surgery information desk.) To find out your arrival time please call [phone] between 1PM - 3PM on 04/03/2019 Thurs  Remember: Instructions that are not followed completely may result in serious medical risk, up to and including death, or upon the discretion of your surgeon and anesthesiologist your surgery may need to be rescheduled.    _x___ 1. Do not eat food after midnight the night before your procedure. You may drink clear liquids up to 2 hours before you are scheduled to arrive at the hospital for your procedure.  Do not drink clear liquids within 2 hours of your scheduled arrival to the hospital.  Clear liquids include  --Water or Apple juice without pulp  --Clear carbohydrate beverage such as ClearFast or Gatorade  --Black Coffee or Clear Tea (No milk, no creamers, do not add anything to                  the coffee or Tea Type 1 and type 2 diabetics should only drink water.   ____Ensure clear carbohydrate drink on the way to the hospital for bariatric patients  ____Ensure clear carbohydrate drink 3 hours before surgery.   No gum chewing or hard candies.     __x__ 2. No Alcohol for 24 hours before or after surgery.   __x__3. No Smoking or e-cigarettes for 24 prior to surgery.  Do not use any chewable tobacco products for at least 6 hour prior to surgery   ____  4. Bring all medications with you on the day of surgery if instructed.    __x__ 5. Notify your doctor if there is any change in your medical condition     (cold, fever, infections).    x___6. On the morning of surgery brush your teeth with toothpaste and water.  You may rinse your mouth with mouth wash if you wish.  Do not swallow any toothpaste or mouthwash.   Do not wear jewelry, make-up, hairpins, clips or nail polish.  Do not wear lotions,  powders, or perfumes. You may wear deodorant.  Do not shave 48 hours prior to surgery. Men may shave face and neck.  Do not bring valuables to the hospital.    [facility] is not responsible for any belongings or valuables.               Contacts, dentures or bridgework may not be worn into surgery.  Leave your suitcase in the car. After surgery it may be brought to your room.  For patients admitted to the hospital, discharge time is determined by your                       treatment team.  _  Patients discharged the day of surgery will not be allowed to drive home.  You will need someone to drive you home and stay with you the night of your procedure.    Please read over the following fact sheets that you were given:   [facility] Preparing for Surgery and or MRSA Information   _x___ Take anti-hypertensive listed below, cardiac, seizure, asthma,     anti-reflux and psychiatric medicines. These include:  1. None  2.  3.  4.  5.  6.  ____Fleets enema or Magnesium Citrate as directed.   ____ Use CHG Soap or  sage wipes as directed on instruction sheet   ____ Use inhalers on the day of surgery and bring to hospital day of surgery  ____ Stop Metformin and Janumet 2 days prior to surgery.    ____ Take 1/2 of usual insulin dose the night before surgery and none on the morning     surgery.   _x___ Follow recommendations from Cardiologist, Pulmonologist or PCP regarding          stopping Aspirin, Coumadin, Plavix ,Eliquis, Effient, or Pradaxa, and Pletal.  X____Stop Anti-inflammatories such as Advil, Aleve, Ibuprofen, Motrin, Naproxen, Naprosyn, Goodies powders or aspirin products. OK to take Tylenol and                          Celebrex.   _x___ Stop supplements until after surgery.  But may continue Vitamin D, Vitamin B,       and multivitamin. Stop fish oils today   ____ Bring C-Pap to the hospital.

## 2019-03-28 NOTE — Pre-Procedure Instructions (Addendum)
Abnormal EKG, secure chat with Dr Lavone Neri, anesthesia. "  "EKG looks stable from earlier, OK to proceed." per Dr Lavone Neri.

## 2019-04-01 ENCOUNTER — Other Ambulatory Visit: Payer: Self-pay

## 2019-04-01 ENCOUNTER — Other Ambulatory Visit
Admission: RE | Admit: 2019-04-01 | Discharge: 2019-04-01 | Disposition: A | Discharge status: Home / Self Care | Payer: Medicare Other | Source: Ambulatory Visit | Attending: Obstetrics & Gynecology | Admitting: Obstetrics & Gynecology

## 2019-04-01 DIAGNOSIS — Z01812 Encounter for preprocedural laboratory examination: Secondary | ICD-10-CM | POA: Insufficient documentation

## 2019-04-01 DIAGNOSIS — Z20828 Contact with and (suspected) exposure to other viral communicable diseases: Secondary | ICD-10-CM | POA: Insufficient documentation

## 2019-04-01 LAB — SARS CORONAVIRUS 2 (TAT 6-24 HRS): SARS Coronavirus 2: NEGATIVE

## 2019-04-04 ENCOUNTER — Encounter: Payer: Self-pay | Admitting: Emergency Medicine

## 2019-04-04 ENCOUNTER — Ambulatory Visit
Admission: RE | Admit: 2019-04-04 | Discharge: 2019-04-04 | Disposition: A | Discharge status: Home / Self Care | Payer: Medicare Other | Attending: Obstetrics & Gynecology | Admitting: Obstetrics & Gynecology

## 2019-04-04 ENCOUNTER — Encounter
Admission: RE | Disposition: A | Discharge status: Home / Self Care | Payer: Self-pay | Source: Home / Self Care | Attending: Obstetrics & Gynecology

## 2019-04-04 ENCOUNTER — Ambulatory Visit: Payer: Medicare Other | Admitting: Anesthesiology

## 2019-04-04 DIAGNOSIS — N840 Polyp of corpus uteri: Secondary | ICD-10-CM | POA: Insufficient documentation

## 2019-04-04 DIAGNOSIS — Z853 Personal history of malignant neoplasm of breast: Secondary | ICD-10-CM | POA: Insufficient documentation

## 2019-04-04 DIAGNOSIS — Z79899 Other long term (current) drug therapy: Secondary | ICD-10-CM | POA: Diagnosis not present

## 2019-04-04 DIAGNOSIS — E7800 Pure hypercholesterolemia, unspecified: Secondary | ICD-10-CM | POA: Diagnosis not present

## 2019-04-04 DIAGNOSIS — I10 Essential (primary) hypertension: Secondary | ICD-10-CM | POA: Diagnosis not present

## 2019-04-04 DIAGNOSIS — N950 Postmenopausal bleeding: Secondary | ICD-10-CM | POA: Insufficient documentation

## 2019-04-04 DIAGNOSIS — Z923 Personal history of irradiation: Secondary | ICD-10-CM | POA: Diagnosis not present

## 2019-04-04 DIAGNOSIS — E785 Hyperlipidemia, unspecified: Secondary | ICD-10-CM | POA: Insufficient documentation

## 2019-04-04 HISTORY — PX: DILATATION & CURETTAGE/HYSTEROSCOPY WITH MYOSURE: SHX6511

## 2019-04-04 LAB — ABO/RH: ABO/RH(D): O POS

## 2019-04-04 SURGERY — DILATATION & CURETTAGE/HYSTEROSCOPY WITH MYOSURE
Anesthesia: General

## 2019-04-04 MED ORDER — SEVOFLURANE IN SOLN
RESPIRATORY_TRACT | Status: AC
  Filled 2019-04-04: qty 250

## 2019-04-04 MED ORDER — DEXAMETHASONE SODIUM PHOSPHATE 10 MG/ML IJ SOLN
INTRAMUSCULAR | Status: DC | PRN
  Administered 2019-04-04: 10 mg via INTRAVENOUS

## 2019-04-04 MED ORDER — ONDANSETRON HCL 4 MG/2ML IJ SOLN: 4.0000 mg | Freq: Once | INTRAMUSCULAR | Status: DC | PRN

## 2019-04-04 MED ORDER — PHENYLEPHRINE HCL (PRESSORS) 10 MG/ML IV SOLN
INTRAVENOUS | Status: DC | PRN
  Administered 2019-04-04 (×2): 100 ug via INTRAVENOUS

## 2019-04-04 MED ORDER — FENTANYL CITRATE (PF) 100 MCG/2ML IJ SOLN
INTRAMUSCULAR | Status: AC
  Filled 2019-04-04: qty 2

## 2019-04-04 MED ORDER — LIDOCAINE HCL (PF) 2 % IJ SOLN
INTRAMUSCULAR | Status: AC
  Filled 2019-04-04: qty 10

## 2019-04-04 MED ORDER — FAMOTIDINE 20 MG PO TABS
ORAL_TABLET | ORAL | Status: AC
  Administered 2019-04-04: 20 mg via ORAL
  Filled 2019-04-04: qty 1

## 2019-04-04 MED ORDER — ONDANSETRON HCL 4 MG/2ML IJ SOLN
INTRAMUSCULAR | Status: AC
  Filled 2019-04-04: qty 2

## 2019-04-04 MED ORDER — ROCURONIUM BROMIDE 100 MG/10ML IV SOLN
INTRAVENOUS | Status: DC | PRN
  Administered 2019-04-04: 5 mg via INTRAVENOUS
  Administered 2019-04-04: 20 mg via INTRAVENOUS

## 2019-04-04 MED ORDER — EPHEDRINE SULFATE 50 MG/ML IJ SOLN
INTRAMUSCULAR | Status: AC
  Filled 2019-04-04: qty 1

## 2019-04-04 MED ORDER — LACTATED RINGERS IV SOLN
INTRAVENOUS | Status: DC
  Administered 2019-04-04: 10:00:00 via INTRAVENOUS

## 2019-04-04 MED ORDER — DEXAMETHASONE SODIUM PHOSPHATE 10 MG/ML IJ SOLN
INTRAMUSCULAR | Status: AC
  Filled 2019-04-04: qty 1

## 2019-04-04 MED ORDER — SUCCINYLCHOLINE CHLORIDE 20 MG/ML IJ SOLN
INTRAMUSCULAR | Status: DC | PRN
  Administered 2019-04-04: 100 mg via INTRAVENOUS

## 2019-04-04 MED ORDER — FENTANYL CITRATE (PF) 100 MCG/2ML IJ SOLN
INTRAMUSCULAR | Status: DC | PRN
  Administered 2019-04-04: 50 ug via INTRAVENOUS

## 2019-04-04 MED ORDER — FENTANYL CITRATE (PF) 100 MCG/2ML IJ SOLN
INTRAMUSCULAR | Status: AC
  Administered 2019-04-04: 25 ug via INTRAVENOUS
  Filled 2019-04-04: qty 2

## 2019-04-04 MED ORDER — ONDANSETRON HCL 4 MG/2ML IJ SOLN
INTRAMUSCULAR | Status: DC | PRN
  Administered 2019-04-04: 4 mg via INTRAVENOUS

## 2019-04-04 MED ORDER — FENTANYL CITRATE (PF) 100 MCG/2ML IJ SOLN
25.0000 ug | INTRAMUSCULAR | Status: DC | PRN
  Administered 2019-04-04 (×2): 25 ug via INTRAVENOUS

## 2019-04-04 MED ORDER — SUGAMMADEX SODIUM 200 MG/2ML IV SOLN
INTRAVENOUS | Status: DC | PRN
  Administered 2019-04-04: 162.4 mg via INTRAVENOUS

## 2019-04-04 MED ORDER — FAMOTIDINE 20 MG PO TABS
20.0000 mg | ORAL_TABLET | Freq: Once | ORAL | Status: AC
  Administered 2019-04-04: 09:00:00 20 mg via ORAL

## 2019-04-04 MED ORDER — GLYCOPYRROLATE 0.2 MG/ML IJ SOLN
INTRAMUSCULAR | Status: DC | PRN
  Administered 2019-04-04: 0.2 mg via INTRAVENOUS

## 2019-04-04 MED ORDER — EPHEDRINE SULFATE 50 MG/ML IJ SOLN
INTRAMUSCULAR | Status: DC | PRN
  Administered 2019-04-04: 5 mg via INTRAVENOUS

## 2019-04-04 MED ORDER — ACETAMINOPHEN 500 MG PO TABS
ORAL_TABLET | ORAL | Status: AC
  Administered 2019-04-04: 1000 mg via ORAL
  Filled 2019-04-04: qty 2

## 2019-04-04 MED ORDER — ACETAMINOPHEN 500 MG PO TABS
1000.0000 mg | ORAL_TABLET | ORAL | Status: AC
  Administered 2019-04-04: 09:00:00 1000 mg via ORAL

## 2019-04-04 MED ORDER — PROPOFOL 10 MG/ML IV BOLUS
INTRAVENOUS | Status: DC | PRN
  Administered 2019-04-04: 120 mg via INTRAVENOUS
  Administered 2019-04-04: 30 mg via INTRAVENOUS

## 2019-04-04 MED ORDER — LIDOCAINE HCL (CARDIAC) PF 100 MG/5ML IV SOSY
PREFILLED_SYRINGE | INTRAVENOUS | Status: DC | PRN
  Administered 2019-04-04: 100 mg via INTRAVENOUS

## 2019-04-04 SURGICAL SUPPLY — 22 items
CATH ROBINSON RED A/P 16FR (CATHETERS) ×3 IMPLANT
COVER WAND RF STERILE (DRAPES) ×3 IMPLANT
DEVICE MYOSURE LITE (MISCELLANEOUS) IMPLANT
DEVICE MYOSURE REACH (MISCELLANEOUS) IMPLANT
ELECT REM PT RETURN 9FT ADLT (ELECTROSURGICAL) ×3
ELECTRODE REM PT RTRN 9FT ADLT (ELECTROSURGICAL) ×1 IMPLANT
GLOVE PI ORTHOPRO 6.5 (GLOVE) ×2
GLOVE PI ORTHOPRO STRL 6.5 (GLOVE) ×1 IMPLANT
GLOVE SURG SYN 6.5 ES PF (GLOVE) ×6 IMPLANT
GOWN STRL REUS W/ TWL LRG LVL3 (GOWN DISPOSABLE) ×2 IMPLANT
GOWN STRL REUS W/TWL LRG LVL3 (GOWN DISPOSABLE) ×4
IV LACTATED RINGERS 1000ML (IV SOLUTION) ×3 IMPLANT
KIT PROCEDURE FLUENT (KITS) IMPLANT
KIT TURNOVER CYSTO (KITS) ×3 IMPLANT
PACK DNC HYST (MISCELLANEOUS) ×3 IMPLANT
PAD OB MATERNITY 4.3X12.25 (PERSONAL CARE ITEMS) ×3 IMPLANT
PAD PREP 24X41 OB/GYN DISP (PERSONAL CARE ITEMS) ×3 IMPLANT
SEAL ROD LENS SCOPE MYOSURE (ABLATOR) ×3 IMPLANT
SOL .9 NS 3000ML IRR  AL (IV SOLUTION)
SOL .9 NS 3000ML IRR UROMATIC (IV SOLUTION) IMPLANT
TUBING CONNECTING 10 (TUBING) ×2 IMPLANT
TUBING CONNECTING 10' (TUBING) ×1

## 2019-04-04 NOTE — Anesthesia Postprocedure Evaluation (Signed)
Anesthesia Post Note  Patient: Teresa Estes  Procedure(s) Performed: DILATATION & CURETTAGE/HYSTEROSCOPY WITH MYOSURE (N/A )  Patient location during evaluation: PACU Anesthesia Type: General Level of consciousness: awake and alert Pain management: pain level controlled Vital Signs Assessment: post-procedure vital signs reviewed and stable Respiratory status: spontaneous breathing, nonlabored ventilation, respiratory function stable and patient connected to nasal cannula oxygen Cardiovascular status: blood pressure returned to baseline and stable Postop Assessment: no apparent nausea or vomiting Anesthetic complications: no     Last Vitals:  Vitals:   04/04/19 1300 04/04/19 1305  BP:  125/69  Pulse: 87 82  Resp: 14 16  Temp:  (!) 36.4 C  SpO2: 93% 93%    Last Pain:  Vitals:   04/04/19 1305  PainSc: Asleep                 Precious Haws Piscitello

## 2019-04-04 NOTE — Discharge Instructions (Signed)
You should expect to have some cramping and vaginal bleeding for about a week. This should taper off and subside, much like a period. If heavy bleeding continues or gets worse, you should contact the office for an earlier appointment.   Please call the office or physician on call for fever >101, severe pain, and heavy bleeding.   [phone]  NOTHING IN THE VAGINA FOR 2 WEEKS!!  Dr. Ward will discuss pathology results with you at your postop visit.    AMBULATORY SURGERY  DISCHARGE INSTRUCTIONS   1) The drugs that you were given will stay in your system until tomorrow so for the next 24 hours you should not:  A) Drive an automobile B) Make any legal decisions C) Drink any alcoholic beverage   2) You may resume regular meals tomorrow.  Today it is better to start with liquids and gradually work up to solid foods.  You may eat anything you prefer, but it is better to start with liquids, then soup and crackers, and gradually work up to solid foods.   3) Please notify your doctor immediately if you have any unusual bleeding, trouble breathing, redness and pain at the surgery site, drainage, fever, or pain not relieved by medication.    4) Additional Instructions:        Please contact your physician with any problems or Same Day Surgery at [phone], Monday through Friday 6 am to 4 pm, or [place] [facility] [place] Main number at [phone].  

## 2019-04-04 NOTE — Anesthesia Post-op Follow-up Note (Signed)
Anesthesia QCDR form completed.        

## 2019-04-04 NOTE — Anesthesia Procedure Notes (Signed)
Procedure Name: Intubation Date/Time: 04/04/2019 11:17 AM Performed by: Alvin Critchley, MD Pre-anesthesia Checklist: Patient identified, Emergency Drugs available, Suction available, Patient being monitored and Timeout performed Patient Re-evaluated:Patient Re-evaluated prior to induction Oxygen Delivery Method: Circle system utilized Preoxygenation: Pre-oxygenation with 100% oxygen Induction Type: IV induction Ventilation: Mask ventilation without difficulty Laryngoscope Size: McGraph and 3 Grade View: Grade I Tube type: Oral Tube size: 7.0 mm Number of attempts: 1 Airway Equipment and Method: Stylet and Oral airway Placement Confirmation: ETT inserted through vocal cords under direct vision,  positive ETCO2 and breath sounds checked- equal and bilateral Secured at: 22 cm Tube secured with: Tape Dental Injury: Teeth and Oropharynx as per pre-operative assessment

## 2019-04-04 NOTE — H&P (Signed)
Preoperative History and Physical  Teresa Estes is a 73 y.o.  here for surgical management of postmenopausal bleeding, and uterine polyp.   No significant preoperative concerns.  Proposed surgery: dilation and curettage, hysteroscopy  Past Medical History:  Diagnosis Date  . Abnormal uterine bleeding   . Breast cancer ([place]) 2010   lumpectomy with mammosite rad tx  . Cancer [facility])    Right breast cancer 2009 with lumpectomy and rad tx  . Elevated cholesterol   . Hypertension   . Personal history of radiation therapy   . Urinary urgency    Past Surgical History:  Procedure Laterality Date  . BREAST CYST ASPIRATION Left yrs ago   neg  . BREAST LUMPECTOMY Right 2010   lumpectomy with mammosite rad tx  . BREAST LUMPECTOMY WITH AXILLARY LYMPH NODE BIOPSY AND MAMMOSITE CAVITY EVALUATION DEVICE Right 2010  . TUBAL LIGATION     OB History  No obstetric history on file.  Patient denies any other pertinent gynecologic issues.   No current facility-administered medications on file prior to encounter.    Current Outpatient Medications on File Prior to Encounter  Medication Sig Dispense Refill  . acetaminophen (TYLENOL) 500 MG tablet Take 1,000 mg by mouth every 6 (six) hours as needed for moderate pain.     . Calcium Carb-Cholecalciferol (CALCIUM-VITAMIN D) 600-400 MG-UNIT TABS Take 1 tablet by mouth daily.     Marland Kitchen lisinopril (PRINIVIL,ZESTRIL) 40 MG tablet Take 40 mg by mouth daily.     Marland Kitchen lovastatin (MEVACOR) 10 MG tablet Take 10 mg by mouth daily with supper.     . Multiple Vitamin (MULTI-VITAMINS) TABS Take 1 tablet by mouth daily.     . Omega-3 Fatty Acids (FISH OIL) 1000 MG CAPS Take 1,000 mg by mouth daily.     Allergies  Allergen Reactions  . Sulfa Antibiotics     Pt states allergic reaction occurred as a child. Pt does not remember reaction.    Social History:   reports that she has never smoked. She has never used smokeless tobacco. She reports that she does not drink  alcohol or use drugs.  Family History  Problem Relation Age of Onset  . Breast cancer Neg Hx     Review of Systems: Noncontributory  PHYSICAL EXAM: Blood pressure (!) 174/81, pulse 98, resp. rate 14, height 5\' 1"  (1.549 m), weight 81.2 kg, SpO2 100 %. General appearance - alert, well appearing, and in no distress Chest - clear to auscultation, no wheezes, rales or rhonchi, symmetric air entry Heart - normal rate and regular rhythm Abdomen - soft, nontender, nondistended, no masses or organomegaly Pelvic - examination not indicated Extremities - peripheral pulses normal, no pedal edema, no clubbing or cyanosis  Labs: Results for orders placed or performed during the hospital encounter of 04/04/19 (from the past 336 hour(s))  ABO/Rh   Collection Time: 04/04/19  9:22 AM  Result Value Ref Range   ABO/RH(D)      O POS Performed at [facility], [address] [place], [place] [address]   Results for orders placed or performed during the hospital encounter of 04/01/19 (from the past 336 hour(s))  SARS CORONAVIRUS 2 (TAT 6-24 HRS) Nasopharyngeal Nasopharyngeal Swab   Collection Time: 04/01/19 10:15 AM   Specimen: Nasopharyngeal Swab  Result Value Ref Range   SARS Coronavirus 2 NEGATIVE NEGATIVE  Results for orders placed or performed during the hospital encounter of 03/28/19 (from the past 336 hour(s))  Type and screen  [place]   Collection Time: 03/28/19  9:18 AM  Result Value Ref Range   ABO/RH(D) O POS    Antibody Screen NEG    Sample Expiration 04/11/2019,2359    Extend sample reason      NO TRANSFUSIONS OR PREGNANCY IN THE PAST 3 MONTHS Performed at [facility], [address], [place], [place] [address]     Imaging Studies: No results found.  Assessment: Patient Active Problem List   Diagnosis Date Noted  . Malignant neoplastic disease ([place]) 08/06/2015  . HLD (hyperlipidemia) 08/06/2015  . BP (high blood pressure)  08/06/2015  . Arthritis, degenerative 08/06/2015    Plan: Patient will undergo surgical management with dilation and curettage, hysteroscopy.   The risks of surgery were discussed in detail with the patient including but not limited to: bleeding which may require transfusion or reoperation; infection which may require antibiotics; injury to surrounding organs which may involve bowel, bladder, ureters ; need for additional procedures including laparoscopy or laparotomy; thromboembolic phenomenon, surgical site problems and other postoperative/anesthesia complications. Likelihood of success in alleviating the patient's condition was discussed. Routine postoperative instructions will be reviewed with the patient and her family in detail after surgery.  The patient concurred with the proposed plan, giving informed written consent for the surgery.  Patient has been NPO since last night she will remain NPO for procedure.  Anesthesia and OR aware.  SCDs ordered on call to the OR.  To OR when ready.  ----- Larey Days, MD, [place] Attending Obstetrician and Gynecologist [facility], [facility]

## 2019-04-04 NOTE — Anesthesia Preprocedure Evaluation (Addendum)
Anesthesia Evaluation  Patient identified by MRN, date of birth, ID band Patient awake    Reviewed: Allergy & Precautions, NPO status , Patient's Chart, lab work & pertinent test results  Airway Mallampati: III  TM Distance: <3 FB     Dental   Pulmonary neg pulmonary ROS,    Pulmonary exam normal        Cardiovascular hypertension, Pt. on medications Normal cardiovascular exam     Neuro/Psych negative neurological ROS  negative psych ROS   GI/Hepatic negative GI ROS, Neg liver ROS,   Endo/Other  negative endocrine ROS  Renal/GU negative Renal ROS  Female GU complaint     Musculoskeletal  (+) Arthritis , Osteoarthritis,    Abdominal Normal abdominal exam  (+)   Peds negative pediatric ROS (+)  Hematology negative hematology ROS (+)   Anesthesia Other Findings Past Medical History: No date: Abnormal uterine bleeding 2010: Breast cancer ([place])     Comment:  lumpectomy with mammosite rad tx No date: Cancer ([place])     Comment:  Right breast cancer 2009 with lumpectomy and rad tx No date: Elevated cholesterol No date: Hypertension No date: Personal history of radiation therapy No date: Urinary urgency  Reproductive/Obstetrics                            Anesthesia Physical Anesthesia Plan  ASA: II  Anesthesia Plan: General   Post-op Pain Management:    Induction: Intravenous  PONV Risk Score and Plan:   Airway Management Planned: Oral ETT  Additional Equipment:   Intra-op Plan:   Post-operative Plan: Extubation in OR  Informed Consent: I have reviewed the patients History and Physical, chart, labs and discussed the procedure including the risks, benefits and alternatives for the proposed anesthesia with the patient or authorized representative who has indicated his/her understanding and acceptance.     Dental advisory given  Plan Discussed with: CRNA and  Surgeon  Anesthesia Plan Comments:       Anesthesia Quick Evaluation

## 2019-04-04 NOTE — Transfer of Care (Signed)
Immediate Anesthesia Transfer of Care Note  Patient: Teresa Estes  Procedure(s) Performed: DILATATION & CURETTAGE/HYSTEROSCOPY WITH MYOSURE (N/A )  Patient Location: PACU  Anesthesia Type:General  Level of Consciousness: sedated  Airway & Oxygen Therapy: Patient Spontanous Breathing and Patient connected to face mask oxygen  Post-op Assessment: Report given to RN and Post -op Vital signs reviewed and stable  Post vital signs: Reviewed and stable  Last Vitals:  Vitals Value Taken Time  BP    Temp    Pulse    Resp    SpO2      Last Pain:  Vitals:   04/04/19 0922  PainSc: 0-No pain         Complications: No apparent anesthesia complications

## 2019-04-04 NOTE — Op Note (Signed)
Operative Report Hysteroscopy, Dilation and Curettage 04/04/2019  Patient:  Teresa Estes  73 y.o. female Preoperative diagnosis:  endometrial polyp, postmenopausal bleeding Postoperative diagnosis:  endometrial polyp, postmenopausal bleeding  PROCEDURE:  Procedure(s): DILATATION & CURETTAGE/HYSTEROSCOPY WITH MYOSURE (N/A) Surgeon:  Surgeon(s) and Role:    * Keon Pender, Honor Loh, MD - Primary Anesthesia:  GET I/O: Total I/O In: 600 [I.V.:600] Out: 5 [Blood:5] Specimens:  Endometrial polyp Complications: None Apparent Disposition:  VS stable to PACU  Findings: Uterus, mobile, normal size, sounding to 6 cm; normal cervix, vagina, perineum, prominent cystocele  Hysteroscopic: single endometrial polyp with posterior fundal attachment.  Bilateral ostea visible. Atrophic lining.  Indication for procedure/Consents: 73 y.o. No obstetric history on file.  here for scheduled surgery for the aforementioned diagnoses.  Risks of surgery were discussed with the patient including but not limited to: bleeding which may require transfusion; infection which may require antibiotics; injury to uterus or surrounding organs; intrauterine scarring which may impair future fertility; need for additional procedures including laparotomy or laparoscopy; and other postoperative/anesthesia complications. Written informed consent was obtained.    Procedure Details:   The patient was then taken to the operating room where anesthesia was administered and was found to be adequate.  After a formal timeout was performed, she was placed in the dorsal lithotomy position and examined with the above findings. She was then prepped and draped in the sterile manner.  A speculum was then placed in the patient's vagina and a single tooth tenaculum was applied to the anterior lip of the cervix.   The uterus was sounded to 6cm. Her cervix was serially dilated to accommodate the myoscope, with findings as above. A sharp curettage was  then performed until there was a gritty texture in all four quadrants. The specimen was handed off to nursing.  The camera was reinserted and confirmed the uterus had been evacuated. The tenaculum was removed from the anterior lip of the cervix and the vaginal speculum was removed after noting good hemostasis. The patient tolerated the procedure well and was taken to the recovery area awake, extubated and in stable condition.  The patient will be discharged to home as per PACU criteria.  Routine postoperative instructions given. She will follow up in the clinic in two to four weeks for postoperative evaluation.  Larey Days, MD [facility] OBGYN Attending Gynecologist

## 2019-04-05 ENCOUNTER — Encounter: Payer: Self-pay | Admitting: Obstetrics & Gynecology

## 2019-04-07 LAB — SURGICAL PATHOLOGY

## 2019-04-09 ENCOUNTER — Other Ambulatory Visit: Payer: Self-pay | Admitting: Internal Medicine

## 2019-04-09 DIAGNOSIS — R1011 Right upper quadrant pain: Secondary | ICD-10-CM

## 2019-04-16 ENCOUNTER — Other Ambulatory Visit: Payer: Self-pay

## 2019-04-16 ENCOUNTER — Ambulatory Visit
Admission: RE | Admit: 2019-04-16 | Discharge: 2019-04-16 | Disposition: A | Discharge status: Home / Self Care | Payer: Medicare Other | Source: Ambulatory Visit | Attending: Internal Medicine | Admitting: Internal Medicine

## 2019-04-16 DIAGNOSIS — R1011 Right upper quadrant pain: Secondary | ICD-10-CM | POA: Diagnosis present

## 2019-04-29 ENCOUNTER — Ambulatory Visit: Payer: Self-pay | Admitting: Surgery

## 2019-05-03 ENCOUNTER — Other Ambulatory Visit: Payer: Self-pay | Admitting: General Surgery

## 2019-06-04 ENCOUNTER — Encounter
Admission: RE | Admit: 2019-06-04 | Discharge: 2019-06-04 | Disposition: A | Discharge status: Home / Self Care | Payer: Medicare PPO | Source: Ambulatory Visit | Attending: General Surgery | Admitting: General Surgery

## 2019-06-04 ENCOUNTER — Other Ambulatory Visit: Payer: Self-pay

## 2019-06-04 NOTE — Patient Instructions (Signed)
Your procedure is scheduled on: 06/13/19 Report to [place]. To find out your arrival time please call [phone] between 1PM - 3PM on 06/12/19.  Remember: Instructions that are not followed completely may result in serious medical risk, up to and including death, or upon the discretion of your surgeon and anesthesiologist your surgery may need to be rescheduled.     _X__ 1. Do not eat food after midnight the night before your procedure.                 No gum chewing or hard candies. You may drink clear liquids up to 2 hours                 before you are scheduled to arrive for your surgery- DO not drink clear                 liquids within 2 hours of the start of your surgery.                 Clear Liquids include:  water, apple juice without pulp, clear carbohydrate                 drink such as Clearfast or Gatorade, Black Coffee or Tea (Do not add                 anything to coffee or tea). Diabetics water only  __X__2.  On the morning of surgery brush your teeth with toothpaste and water, you                 may rinse your mouth with mouthwash if you wish.  Do not swallow any              toothpaste of mouthwash.     _X__ 3.  No Alcohol for 24 hours before or after surgery.   _X__ 4.  Do Not Smoke or use e-cigarettes For 24 Hours Prior to Your Surgery.                 Do not use any chewable tobacco products for at least 6 hours prior to                 surgery.  ____  5.  Bring all medications with you on the day of surgery if instructed.   __X__  6.  Notify your doctor if there is any change in your medical condition      (cold, fever, infections).     Do not wear jewelry, make-up, hairpins, clips or nail polish. Do not wear lotions, powders, or perfumes.  Do not shave 48 hours prior to surgery. Men may shave face and neck. Do not bring valuables to the hospital.    [facility] is not responsible for any belongings or  valuables.  Contacts, dentures/partials or body piercings may not be worn into surgery. Bring a case for your contacts, glasses or hearing aids, a denture cup will be supplied. Leave your suitcase in the car. After surgery it may be brought to your room. For patients admitted to the hospital, discharge time is determined by your treatment team.   Patients discharged the day of surgery will not be allowed to drive home.   Please read over the following fact sheets that you were given:   MRSA Information  __X__ Take these medicines the morning of surgery with A SIP OF WATER:  1. none  2.   3.   4.  5.  6.  ____ Fleet Enema (as directed)   __ __ Use CHG Soap/SAGE wipes as directed    ____ Use inhalers on the day of surgery  ____ Stop metformin/Janumet/Farxiga 2 days prior to surgery    ____ Take 1/2 of usual insulin dose the night before surgery. No insulin the morning          of surgery.   ____ Stop Blood Thinners Coumadin/Plavix/Xarelto/Pleta/Pradaxa/Eliquis/Effient/Aspirin  on   Or contact your Surgeon, Cardiologist or Medical Doctor regarding  ability to stop your blood thinners  __X__ Stop Anti-inflammatories 7 days before surgery such as Advil, Ibuprofen, Motrin,  BC or Goodies Powder, Naprosyn, Naproxen, Aleve, Aspirin    __X__ Stop all herbal supplements, fish oil or vitamin E until after surgery.    ____ Bring C-Pap to the hospital.

## 2019-06-11 ENCOUNTER — Other Ambulatory Visit: Payer: Self-pay

## 2019-06-11 ENCOUNTER — Other Ambulatory Visit
Admission: RE | Admit: 2019-06-11 | Discharge: 2019-06-11 | Disposition: A | Discharge status: Home / Self Care | Payer: Medicare PPO | Source: Ambulatory Visit | Attending: General Surgery | Admitting: General Surgery

## 2019-06-11 DIAGNOSIS — Z20822 Contact with and (suspected) exposure to covid-19: Secondary | ICD-10-CM | POA: Diagnosis not present

## 2019-06-11 DIAGNOSIS — Z01812 Encounter for preprocedural laboratory examination: Secondary | ICD-10-CM | POA: Diagnosis present

## 2019-06-11 LAB — SARS CORONAVIRUS 2 (TAT 6-24 HRS): SARS Coronavirus 2: NEGATIVE

## 2019-06-12 MED ORDER — CEFAZOLIN SODIUM-DEXTROSE 2-4 GM/100ML-% IV SOLN
2.0000 g | INTRAVENOUS | Status: AC
  Administered 2019-06-13: 2 g via INTRAVENOUS

## 2019-06-13 ENCOUNTER — Encounter: Payer: Self-pay | Admitting: General Surgery

## 2019-06-13 ENCOUNTER — Ambulatory Visit: Payer: Medicare PPO

## 2019-06-13 ENCOUNTER — Ambulatory Visit: Payer: Medicare PPO | Admitting: Certified Registered Nurse Anesthetist

## 2019-06-13 ENCOUNTER — Encounter
Admission: RE | Disposition: A | Discharge status: Home / Self Care | Payer: Self-pay | Source: Ambulatory Visit | Attending: General Surgery

## 2019-06-13 ENCOUNTER — Ambulatory Visit
Admission: RE | Admit: 2019-06-13 | Discharge: 2019-06-13 | Disposition: A | Discharge status: Home / Self Care | Payer: Medicare PPO | Source: Ambulatory Visit | Attending: General Surgery | Admitting: General Surgery

## 2019-06-13 ENCOUNTER — Other Ambulatory Visit: Payer: Self-pay

## 2019-06-13 DIAGNOSIS — K8010 Calculus of gallbladder with chronic cholecystitis without obstruction: Secondary | ICD-10-CM | POA: Diagnosis present

## 2019-06-13 DIAGNOSIS — Z853 Personal history of malignant neoplasm of breast: Secondary | ICD-10-CM | POA: Insufficient documentation

## 2019-06-13 DIAGNOSIS — M8580 Other specified disorders of bone density and structure, unspecified site: Secondary | ICD-10-CM | POA: Diagnosis not present

## 2019-06-13 DIAGNOSIS — E7800 Pure hypercholesterolemia, unspecified: Secondary | ICD-10-CM | POA: Diagnosis not present

## 2019-06-13 DIAGNOSIS — E785 Hyperlipidemia, unspecified: Secondary | ICD-10-CM | POA: Diagnosis not present

## 2019-06-13 DIAGNOSIS — I10 Essential (primary) hypertension: Secondary | ICD-10-CM | POA: Insufficient documentation

## 2019-06-13 DIAGNOSIS — Z79899 Other long term (current) drug therapy: Secondary | ICD-10-CM | POA: Insufficient documentation

## 2019-06-13 DIAGNOSIS — Z419 Encounter for procedure for purposes other than remedying health state, unspecified: Secondary | ICD-10-CM

## 2019-06-13 HISTORY — PX: CHOLECYSTECTOMY: SHX55

## 2019-06-13 SURGERY — LAPAROSCOPIC CHOLECYSTECTOMY WITH INTRAOPERATIVE CHOLANGIOGRAM
Anesthesia: General | Site: Abdomen

## 2019-06-13 MED ORDER — ONDANSETRON HCL 4 MG/2ML IJ SOLN: 4.0000 mg | Freq: Once | INTRAMUSCULAR | Status: DC | PRN

## 2019-06-13 MED ORDER — LIDOCAINE HCL (CARDIAC) PF 100 MG/5ML IV SOSY
PREFILLED_SYRINGE | INTRAVENOUS | Status: DC | PRN
  Administered 2019-06-13: 60 mg via INTRAVENOUS

## 2019-06-13 MED ORDER — FENTANYL CITRATE (PF) 100 MCG/2ML IJ SOLN
25.0000 ug | INTRAMUSCULAR | Status: DC | PRN
  Administered 2019-06-13: 25 ug via INTRAVENOUS

## 2019-06-13 MED ORDER — KETOROLAC TROMETHAMINE 30 MG/ML IJ SOLN
INTRAMUSCULAR | Status: DC | PRN
  Administered 2019-06-13: 15 mg via INTRAVENOUS

## 2019-06-13 MED ORDER — SUGAMMADEX SODIUM 200 MG/2ML IV SOLN
INTRAVENOUS | Status: AC
  Filled 2019-06-13: qty 2

## 2019-06-13 MED ORDER — ROCURONIUM BROMIDE 50 MG/5ML IV SOLN
INTRAVENOUS | Status: AC
  Filled 2019-06-13: qty 1

## 2019-06-13 MED ORDER — FAMOTIDINE 20 MG PO TABS: 20.0000 mg | ORAL_TABLET | Freq: Once | ORAL | Status: AC

## 2019-06-13 MED ORDER — SUGAMMADEX SODIUM 200 MG/2ML IV SOLN
INTRAVENOUS | Status: DC | PRN
  Administered 2019-06-13: 150 mg via INTRAVENOUS

## 2019-06-13 MED ORDER — LACTATED RINGERS IV SOLN: INTRAVENOUS | Status: DC

## 2019-06-13 MED ORDER — HYDROCODONE-ACETAMINOPHEN 5-325 MG PO TABS
1.0000 | ORAL_TABLET | Freq: Once | ORAL | Status: AC
  Administered 2019-06-13: 1 via ORAL

## 2019-06-13 MED ORDER — FAMOTIDINE 20 MG PO TABS
ORAL_TABLET | ORAL | Status: AC
  Administered 2019-06-13: 11:00:00 20 mg via ORAL
  Filled 2019-06-13: qty 1

## 2019-06-13 MED ORDER — GLYCOPYRROLATE 0.2 MG/ML IJ SOLN
INTRAMUSCULAR | Status: AC
  Filled 2019-06-13: qty 1

## 2019-06-13 MED ORDER — FENTANYL CITRATE (PF) 100 MCG/2ML IJ SOLN
INTRAMUSCULAR | Status: DC | PRN
  Administered 2019-06-13: 25 ug via INTRAVENOUS
  Administered 2019-06-13: 50 ug via INTRAVENOUS
  Administered 2019-06-13: 25 ug via INTRAVENOUS

## 2019-06-13 MED ORDER — FENTANYL CITRATE (PF) 100 MCG/2ML IJ SOLN
INTRAMUSCULAR | Status: AC
  Administered 2019-06-13: 25 ug via INTRAVENOUS
  Filled 2019-06-13: qty 2

## 2019-06-13 MED ORDER — KETOROLAC TROMETHAMINE 30 MG/ML IJ SOLN
INTRAMUSCULAR | Status: AC
  Filled 2019-06-13: qty 1

## 2019-06-13 MED ORDER — SODIUM CHLORIDE 0.9 % IV SOLN
INTRAVENOUS | Status: DC | PRN
  Administered 2019-06-13: 12:00:00 20 mL

## 2019-06-13 MED ORDER — LACTATED RINGERS IV SOLN: INTRAVENOUS | Status: DC | PRN

## 2019-06-13 MED ORDER — DEXAMETHASONE SODIUM PHOSPHATE 10 MG/ML IJ SOLN
INTRAMUSCULAR | Status: AC
  Filled 2019-06-13: qty 1

## 2019-06-13 MED ORDER — HYDROCODONE-ACETAMINOPHEN 5-325 MG PO TABS
ORAL_TABLET | ORAL | Status: AC
  Filled 2019-06-13: qty 1

## 2019-06-13 MED ORDER — LIDOCAINE HCL (PF) 2 % IJ SOLN
INTRAMUSCULAR | Status: AC
  Filled 2019-06-13: qty 5

## 2019-06-13 MED ORDER — DEXAMETHASONE SODIUM PHOSPHATE 10 MG/ML IJ SOLN
INTRAMUSCULAR | Status: DC | PRN
  Administered 2019-06-13: 10 mg via INTRAVENOUS

## 2019-06-13 MED ORDER — FENTANYL CITRATE (PF) 100 MCG/2ML IJ SOLN
INTRAMUSCULAR | Status: AC
  Filled 2019-06-13: qty 2

## 2019-06-13 MED ORDER — PROPOFOL 10 MG/ML IV BOLUS
INTRAVENOUS | Status: DC | PRN
  Administered 2019-06-13: 130 mg via INTRAVENOUS

## 2019-06-13 MED ORDER — ONDANSETRON HCL 4 MG/2ML IJ SOLN
INTRAMUSCULAR | Status: DC | PRN
  Administered 2019-06-13: 4 mg via INTRAVENOUS

## 2019-06-13 MED ORDER — ACETAMINOPHEN 10 MG/ML IV SOLN
INTRAVENOUS | Status: DC | PRN
  Administered 2019-06-13: 1000 mg via INTRAVENOUS

## 2019-06-13 MED ORDER — PROPOFOL 10 MG/ML IV BOLUS
INTRAVENOUS | Status: AC
  Filled 2019-06-13: qty 20

## 2019-06-13 MED ORDER — ACETAMINOPHEN 10 MG/ML IV SOLN
INTRAVENOUS | Status: AC
  Filled 2019-06-13: qty 100

## 2019-06-13 MED ORDER — ONDANSETRON HCL 4 MG/2ML IJ SOLN
INTRAMUSCULAR | Status: AC
  Filled 2019-06-13: qty 2

## 2019-06-13 MED ORDER — CEFAZOLIN SODIUM-DEXTROSE 2-4 GM/100ML-% IV SOLN
INTRAVENOUS | Status: AC
  Filled 2019-06-13: qty 100

## 2019-06-13 MED ORDER — GLYCOPYRROLATE 0.2 MG/ML IJ SOLN
INTRAMUSCULAR | Status: DC | PRN
  Administered 2019-06-13: .2 mg via INTRAVENOUS

## 2019-06-13 MED ORDER — SODIUM CHLORIDE (PF) 0.9 % IJ SOLN
INTRAMUSCULAR | Status: AC
  Filled 2019-06-13: qty 50

## 2019-06-13 MED ORDER — HYDROCODONE-ACETAMINOPHEN 5-325 MG PO TABS: 1.0000 | ORAL_TABLET | ORAL | 0 refills | Status: AC | PRN

## 2019-06-13 MED ORDER — ROCURONIUM BROMIDE 100 MG/10ML IV SOLN
INTRAVENOUS | Status: DC | PRN
  Administered 2019-06-13: 50 mg via INTRAVENOUS

## 2019-06-13 MED ORDER — PHENYLEPHRINE HCL (PRESSORS) 10 MG/ML IV SOLN
INTRAVENOUS | Status: DC | PRN
  Administered 2019-06-13: 100 ug via INTRAVENOUS

## 2019-06-13 SURGICAL SUPPLY — 41 items
APPLIER CLIP ROT 10 11.4 M/L (STAPLE) ×3
BLADE SURG 11 STRL SS SAFETY (MISCELLANEOUS) ×3 IMPLANT
CANISTER SUCT 1200ML W/VALVE (MISCELLANEOUS) ×3 IMPLANT
CANNULA DILATOR  5MM W/SLV (CANNULA) ×2
CANNULA DILATOR 10 W/SLV (CANNULA) ×2 IMPLANT
CANNULA DILATOR 10MM W/SLV (CANNULA) ×1
CANNULA DILATOR 5 W/SLV (CANNULA) ×4 IMPLANT
CATH CHOLANG 76X19 KUMAR (CATHETERS) ×3 IMPLANT
CHLORAPREP W/TINT 26 (MISCELLANEOUS) ×3 IMPLANT
CLIP APPLIE ROT 10 11.4 M/L (STAPLE) ×1 IMPLANT
CLOSURE WOUND 1/2 X4 (GAUZE/BANDAGES/DRESSINGS) ×1
CONRAY 60ML FOR OR (MISCELLANEOUS) ×3 IMPLANT
COVER WAND RF STERILE (DRAPES) ×3 IMPLANT
DISSECTOR KITTNER STICK (MISCELLANEOUS) ×1 IMPLANT
DISSECTORS/KITTNER STICK (MISCELLANEOUS) ×3
DRAPE 3/4 80X56 (DRAPES) ×3 IMPLANT
DRSG TEGADERM 2-3/8X2-3/4 SM (GAUZE/BANDAGES/DRESSINGS) ×12 IMPLANT
DRSG TELFA 4X3 1S NADH ST (GAUZE/BANDAGES/DRESSINGS) ×3 IMPLANT
ELECT REM PT RETURN 9FT ADLT (ELECTROSURGICAL) ×3
ELECTRODE REM PT RTRN 9FT ADLT (ELECTROSURGICAL) ×1 IMPLANT
GLOVE BIO SURGEON STRL SZ7.5 (GLOVE) ×3 IMPLANT
GLOVE INDICATOR 8.0 STRL GRN (GLOVE) ×3 IMPLANT
GOWN STRL REUS W/ TWL LRG LVL3 (GOWN DISPOSABLE) ×3 IMPLANT
GOWN STRL REUS W/TWL LRG LVL3 (GOWN DISPOSABLE) ×6
IRRIGATION STRYKERFLOW (MISCELLANEOUS) ×1 IMPLANT
IRRIGATOR STRYKERFLOW (MISCELLANEOUS) ×3
IV LACTATED RINGERS 1000ML (IV SOLUTION) ×3 IMPLANT
KIT TURNOVER KIT A (KITS) ×3 IMPLANT
LABEL OR SOLS (LABEL) ×3 IMPLANT
NDL INSUFF ACCESS 14 VERSASTEP (NEEDLE) ×3 IMPLANT
NS IRRIG 500ML POUR BTL (IV SOLUTION) ×3 IMPLANT
PACK LAP CHOLECYSTECTOMY (MISCELLANEOUS) ×3 IMPLANT
POUCH SPECIMEN RETRIEVAL 10MM (ENDOMECHANICALS) IMPLANT
SCISSORS METZENBAUM CVD 33 (INSTRUMENTS) ×3 IMPLANT
SET TUBE SMOKE EVAC HIGH FLOW (TUBING) ×3 IMPLANT
STRIP CLOSURE SKIN 1/2X4 (GAUZE/BANDAGES/DRESSINGS) ×2 IMPLANT
SUT VIC AB 0 CT2 27 (SUTURE) ×3 IMPLANT
SUT VIC AB 4-0 FS2 27 (SUTURE) ×3 IMPLANT
SWABSTK COMLB BENZOIN TINCTURE (MISCELLANEOUS) ×3 IMPLANT
TROCAR XCEL NON-BLD 11X100MML (ENDOMECHANICALS) ×3 IMPLANT
WATER STERILE IRR 1000ML POUR (IV SOLUTION) ×3 IMPLANT

## 2019-06-13 NOTE — Transfer of Care (Signed)
Immediate Anesthesia Transfer of Care Note  Patient: Teresa Estes  Procedure(s) Performed: LAPAROSCOPIC CHOLECYSTECTOMY WITH INTRAOPERATIVE CHOLANGIOGRAM (N/A Abdomen)  Patient Location: PACU  Anesthesia Type:General  Level of Consciousness: sedated  Airway & Oxygen Therapy: Patient Spontanous Breathing and Patient connected to face mask oxygen  Post-op Assessment: Report given to RN and Post -op Vital signs reviewed and stable  Post vital signs: Reviewed and stable  Last Vitals:  Vitals Value Taken Time  BP 168/78   Temp    Pulse 93 06/13/19 1249  Resp 17 06/13/19 1249  SpO2 98 % 06/13/19 1249  Vitals shown include unvalidated device data.  Last Pain:  Vitals:   06/13/19 1043  TempSrc: Temporal  PainSc: 0-No pain         Complications: No apparent anesthesia complications

## 2019-06-13 NOTE — H&P (Signed)
Teresa Estes [id number] Jun 13, 1945     HPI:  74 y/o woman with abdominal discomfort and cholelithiasis.  Reports two mild episodes of RUQ pain since December office evaluation.    Medications Prior to Admission  Medication Sig Dispense Refill Last Dose  . acetaminophen (TYLENOL) 500 MG tablet Take 1,000 mg by mouth every 6 (six) hours as needed for moderate pain.    Past Week at Unknown time  . Calcium Carb-Cholecalciferol (CALCIUM-VITAMIN D) 600-400 MG-UNIT TABS Take 1 tablet by mouth daily.    Past Week at Unknown time  . dicyclomine (BENTYL) 20 MG tablet Take 20 mg by mouth 3 (three) times daily as needed for spasms.    Past Week at Unknown time  . lisinopril (PRINIVIL,ZESTRIL) 40 MG tablet Take 40 mg by mouth daily.    06/12/2019 at Unknown time  . lovastatin (MEVACOR) 10 MG tablet Take 10 mg by mouth daily with supper.    06/12/2019 at Unknown time  . Multiple Vitamin (MULTI-VITAMINS) TABS Take 1 tablet by mouth daily.    Past Week at Unknown time  . Omega-3 Fatty Acids (FISH OIL) 1000 MG CAPS Take 1,000 mg by mouth daily.   Past Week at Unknown time   Allergies  Allergen Reactions  . Sulfa Antibiotics     Pt states allergic reaction occurred as a child. Pt does not remember reaction.   Past Medical History:  Diagnosis Date  . Abnormal uterine bleeding   . Breast cancer ([place]) 2010   lumpectomy with mammosite rad tx  . Cancer [facility])    Right breast cancer 2009 with lumpectomy and rad tx  . Elevated cholesterol   . Hypertension   . Personal history of radiation therapy   . Urinary urgency    Past Surgical History:  Procedure Laterality Date  . BREAST CYST ASPIRATION Left yrs ago   neg  . BREAST LUMPECTOMY Right 2010   lumpectomy with mammosite rad tx  . BREAST LUMPECTOMY WITH AXILLARY LYMPH NODE BIOPSY AND MAMMOSITE CAVITY EVALUATION DEVICE Right 2010  . DILATATION & CURETTAGE/HYSTEROSCOPY WITH MYOSURE N/A 04/04/2019   Procedure: Arial;  Surgeon: Ward, Honor Loh, MD;  Location: [facility] ORS;  Service: Gynecology;  Laterality: N/A;  . DILATION AND CURETTAGE OF UTERUS    . TUBAL LIGATION     Social History   Socioeconomic History  . Marital status: Married    Spouse name: Not on file  . Number of children: Not on file  . Years of education: Not on file  . Highest education level: Not on file  Occupational History  . Not on file  Tobacco Use  . Smoking status: Never Smoker  . Smokeless tobacco: Never Used  Substance and Sexual Activity  . Alcohol use: Never  . Drug use: Never  . Sexual activity: Not on file  Other Topics Concern  . Not on file  Social History Narrative  . Not on file   Social Determinants of Health   Financial Resource Strain:   . Difficulty of Paying Living Expenses: Not on file  Food Insecurity:   . Worried About Charity fundraiser in the Last Year: Not on file  . Ran Out of Food in the Last Year: Not on file  Transportation Needs:   . Lack of Transportation (Medical): Not on file  . Lack of Transportation (Non-Medical): Not on file  Physical Activity:   . Days of Exercise per Week: Not on file  .  Minutes of Exercise per Session: Not on file  Stress:   . Feeling of Stress : Not on file  Social Connections:   . Frequency of Communication with Friends and Family: Not on file  . Frequency of Social Gatherings with Friends and Family: Not on file  . Attends Religious Services: Not on file  . Active Member of Clubs or Organizations: Not on file  . Attends Archivist Meetings: Not on file  . Marital Status: Not on file  Intimate Partner Violence:   . Fear of Current or Ex-Partner: Not on file  . Emotionally Abused: Not on file  . Physically Abused: Not on file  . Sexually Abused: Not on file   Social History   Social History Narrative  . Not on file     ROS: Negative.     PE: HEENT: Negative. Lungs: Clear. Cardio: RR.    Assessment/Plan:  Proceed  with planned gallbladder removal.   Forest Gleason [facility] 06/13/2019

## 2019-06-13 NOTE — Anesthesia Preprocedure Evaluation (Signed)
Anesthesia Evaluation  Patient identified by MRN, date of birth, ID band Patient awake    Reviewed: Allergy & Precautions, NPO status , Patient's Chart, lab work & pertinent test results  History of Anesthesia Complications Negative for: history of anesthetic complications  Airway Mallampati: II       Dental   Pulmonary neg sleep apnea, neg COPD, Not current smoker,           Cardiovascular hypertension, Pt. on medications (-) Past MI and (-) CHF (-) dysrhythmias (-) Valvular Problems/Murmurs     Neuro/Psych neg Seizures    GI/Hepatic Neg liver ROS, neg GERD  ,  Endo/Other  neg diabetes  Renal/GU negative Renal ROS     Musculoskeletal   Abdominal   Peds  Hematology   Anesthesia Other Findings   Reproductive/Obstetrics                             Anesthesia Physical Anesthesia Plan  ASA: II  Anesthesia Plan: General   Post-op Pain Management:    Induction: Intravenous  PONV Risk Score and Plan: 3 and Dexamethasone, Ondansetron and Treatment may vary due to age or medical condition  Airway Management Planned: Oral ETT  Additional Equipment:   Intra-op Plan:   Post-operative Plan:   Informed Consent: I have reviewed the patients History and Physical, chart, labs and discussed the procedure including the risks, benefits and alternatives for the proposed anesthesia with the patient or authorized representative who has indicated his/her understanding and acceptance.       Plan Discussed with:   Anesthesia Plan Comments:         Anesthesia Quick Evaluation

## 2019-06-13 NOTE — Anesthesia Postprocedure Evaluation (Signed)
Anesthesia Post Note  Patient: Teresa Estes  Procedure(s) Performed: LAPAROSCOPIC CHOLECYSTECTOMY WITH INTRAOPERATIVE CHOLANGIOGRAM (N/A Abdomen)  Patient location during evaluation: PACU Anesthesia Type: General Level of consciousness: awake and alert and oriented Pain management: pain level controlled Vital Signs Assessment: post-procedure vital signs reviewed and stable Respiratory status: spontaneous breathing, nonlabored ventilation and respiratory function stable Cardiovascular status: blood pressure returned to baseline and stable Postop Assessment: no signs of nausea or vomiting Anesthetic complications: no     Last Vitals:  Vitals:   06/13/19 1349 06/13/19 1404  BP: 137/75 (!) 141/74  Pulse: 79 84  Resp: 15 13  Temp:    SpO2: 93% 97%    Last Pain:  Vitals:   06/13/19 1404  TempSrc:   PainSc: 4                  Ostin Mathey

## 2019-06-13 NOTE — Discharge Instructions (Signed)
Laparoscopic Cholecystectomy, Care After This sheet gives you information about how to care for yourself after your procedure. Your doctor may also give you more specific instructions. If you have problems or questions, contact your doctor. Follow these instructions at home: Care for cuts from surgery (incisions)   Follow instructions from your doctor about how to take care of your cuts from surgery. Make sure you: ? Wash your hands with soap and water before you change your bandage (dressing). If you cannot use soap and water, use hand sanitizer. ? Change your bandage as told by your doctor. ? Leave stitches (sutures), skin glue, or skin tape (adhesive) strips in place. They may need to stay in place for 2 weeks or longer. If tape strips get loose and curl up, you may trim the loose edges. Do not remove tape strips completely unless your doctor says it is okay.  Do not take baths, swim, or use a hot tub until your doctor says it is okay. Ask your doctor if you can take showers. You may only be allowed to take sponge baths for bathing.  Check your surgical cut area every day for signs of infection. Check for: ? More redness, swelling, or pain. ? More fluid or blood. ? Warmth. ? Pus or a bad smell. Activity  Do not drive or use heavy machinery while taking prescription pain medicine.  Do not lift anything that is heavier than 10 lb (4.5 kg) until your doctor says it is okay.  Do not play contact sports until your doctor says it is okay.  Do not drive for 24 hours if you were given a medicine to help you relax (sedative).  Rest as needed. Do not return to work or school until your doctor says it is okay. General instructions  Take over-the-counter and prescription medicines only as told by your doctor.  To prevent or treat constipation while you are taking prescription pain medicine, your doctor may recommend that you: ? Drink enough fluid to keep your pee (urine) clear or pale  yellow. ? Take over-the-counter or prescription medicines. ? Eat foods that are high in fiber, such as fresh fruits and vegetables, whole grains, and beans. ? Limit foods that are high in fat and processed sugars, such as fried and sweet foods. Contact a doctor if:  You develop a rash.  You have more redness, swelling, or pain around your surgical cuts.  You have more fluid or blood coming from your surgical cuts.  Your surgical cuts feel warm to the touch.  You have pus or a bad smell coming from your surgical cuts.  You have a fever.  One or more of your surgical cuts breaks open. Get help right away if:  You have trouble breathing.  You have chest pain.  You have pain that is getting worse in your shoulders.  You faint or feel dizzy when you stand.  You have very bad pain in your belly (abdomen).  You are sick to your stomach (nauseous) for more than one day.  You have throwing up (vomiting) that lasts for more than one day.  You have leg pain. This information is not intended to replace advice given to you by your health care provider. Make sure you discuss any questions you have with your health care provider. Document Revised: 04/27/2017 Document Reviewed: 11/01/2015 Elsevier Patient Education  2020 [place]   1) The drugs that you were given  will stay in your system until tomorrow so for the next 24 hours you should not:  A) Drive an automobile B) Make any legal decisions C) Drink any alcoholic beverage   2) You may resume regular meals tomorrow.  Today it is better to start with liquids and gradually work up to solid foods.  You may eat anything you prefer, but it is better to start with liquids, then soup and crackers, and gradually work up to solid foods.   3) Please notify your doctor immediately if you have any unusual bleeding, trouble breathing, redness and pain at the surgery site,  drainage, fever, or pain not relieved by medication. 4)   5) Your post-operative visit with Dr.                                     is: Date:                        Time:    Please call to schedule your post-operative visit.  6) Additional Instructions:

## 2019-06-13 NOTE — Op Note (Signed)
Preoperative diagnosis: Episodic right upper quadrant and epigastric pain, cholelithiasis.  Postoperative diagnosis: Same.  Operative procedure: Laparoscopic cholecystectomy with intraoperative cholangiograms.  Operating surgeon: Hervey Ard, MD.  Anesthesia: General endotracheal.  Estimated blood loss: Less than 5 cc.  Clinical note: This 74 year old woman has had episodic abdominal pain.  Imaging study shows evidence of a large calcified stone.  No other pathology.  Endoscopy of the colon several years ago was unremarkable.  She is felt to be a candidate for elective cholecystectomy.  Operative note: With the patient under adequate general endotracheal anesthesia with SCD stockings for DVT prevention and having previously received Ancef she underwent general anesthesia without difficulty.  The abdomen was cleansed with ChloraPrep and draped.  In Trendelenburg position a varies needle was placed with transumbilical incision.  After assuring intra-abdominal location with a hanging drop test the abdomen was insufflated with CO2 at 10 mmHg pressure.  A 10 mm Neck step port was expanded and inspection showed no evidence of injury from initial port placement.  The patient was placed in reverse Trendelenburg position and rolled to the left.  Adhesions of the omentum to the undersurface of the gallbladder were noted from the fundus to the neck.  An 11 mm XL port was placed in the epigastrium.  Numeral 2-5 mm Ports were placed in the right lateral abdominal wall.  The gallbladder was placed on cephalad traction.  Adhesions were freed and the neck of the gallbladder dissected.  The cystic duct and cystic artery were identified as well as a small mildly inflamed cystic duct lymph node.  Intraoperative cholangiograms were completed using one half strength Conray 60.  A Kumar clamp was placed for cholangiograms.  Prompt filling of the right and left hepatic duct and free flow into the duodenum.  The cystic  duct and cystic artery were doubly clipped and divided.  The gallbladder was removed from the liver bed making use of hook cautery dissection.  This was delivered to the umbilical port site.  The incision here was expanded slightly to allow easy extraction of the large stone.  After reestablishing pneumoperitoneum the right upper quadrant was irrigated with lactated Ringer solution.  Good hemostasis was noted.  The abdomen was then desufflated under direct vision and ports were removed.  The fascia at the umbilicus was closed with a 0 Vicryl figure-of-eight suture.  Skin incisions were closed with 4-0 Vicryl subcuticular sutures.  Benzoin, Steri-Strips, Telfa and Tegaderm dressings were applied.  The patient tolerated the procedure well and was taken recovery in stable condition.

## 2019-06-13 NOTE — Anesthesia Procedure Notes (Signed)
Procedure Name: Intubation Date/Time: 06/13/2019 11:39 AM Performed by: Jerrye Noble, CRNA Pre-anesthesia Checklist: Patient identified, Emergency Drugs available, Suction available and Patient being monitored Patient Re-evaluated:Patient Re-evaluated prior to induction Oxygen Delivery Method: Circle system utilized Preoxygenation: Pre-oxygenation with 100% oxygen Induction Type: IV induction Ventilation: Mask ventilation without difficulty Laryngoscope Size: McGraph and 3 Grade View: Grade I Tube type: Oral Tube size: 7.0 mm Number of attempts: 1 Airway Equipment and Method: Stylet and Video-laryngoscopy Placement Confirmation: ETT inserted through vocal cords under direct vision,  positive ETCO2 and breath sounds checked- equal and bilateral Secured at: 22 cm Tube secured with: Tape Dental Injury: Teeth and Oropharynx as per pre-operative assessment

## 2019-06-16 LAB — SURGICAL PATHOLOGY

## 2019-12-25 ENCOUNTER — Encounter: Payer: Self-pay | Admitting: Dermatology

## 2019-12-25 ENCOUNTER — Ambulatory Visit: Payer: Medicare PPO | Admitting: Dermatology

## 2019-12-25 ENCOUNTER — Other Ambulatory Visit: Payer: Self-pay

## 2019-12-25 DIAGNOSIS — L570 Actinic keratosis: Secondary | ICD-10-CM

## 2019-12-25 DIAGNOSIS — Z86018 Personal history of other benign neoplasm: Secondary | ICD-10-CM

## 2019-12-25 DIAGNOSIS — Z85828 Personal history of other malignant neoplasm of skin: Secondary | ICD-10-CM | POA: Diagnosis not present

## 2019-12-25 DIAGNOSIS — L820 Inflamed seborrheic keratosis: Secondary | ICD-10-CM

## 2019-12-25 DIAGNOSIS — D229 Melanocytic nevi, unspecified: Secondary | ICD-10-CM

## 2019-12-25 DIAGNOSIS — Z1283 Encounter for screening for malignant neoplasm of skin: Secondary | ICD-10-CM | POA: Diagnosis not present

## 2019-12-25 DIAGNOSIS — D1800 Hemangioma unspecified site: Secondary | ICD-10-CM

## 2019-12-25 DIAGNOSIS — L739 Follicular disorder, unspecified: Secondary | ICD-10-CM

## 2019-12-25 DIAGNOSIS — L821 Other seborrheic keratosis: Secondary | ICD-10-CM

## 2019-12-25 DIAGNOSIS — L814 Other melanin hyperpigmentation: Secondary | ICD-10-CM

## 2019-12-25 DIAGNOSIS — L578 Other skin changes due to chronic exposure to nonionizing radiation: Secondary | ICD-10-CM

## 2019-12-25 MED ORDER — TRIAMCINOLONE ACETONIDE 0.1 % EX CREA: 1.0000 "application " | TOPICAL_CREAM | CUTANEOUS | 0 refills | Status: DC

## 2019-12-25 NOTE — Patient Instructions (Addendum)
Recommend using Cln Sport Wash on arms and legs daily, leave on for 1-2 minutes before rinsing off. This can be purchased at [employer] or online.    Patient to call in 2 weeks to let us know if rash on arms and legs is improving. [phone] ext [phone]

## 2019-12-25 NOTE — Progress Notes (Signed)
Follow-Up Visit   Subjective  Teresa Estes is a 74 y.o. female who presents for the following: Annual Exam (Total body skin exam, hx BCC's, Dysplastic Nevi, AK's) and itchy bumps (arms and legs x 1 month, itchy). The patient presents for Total-Body Skin Exam (TBSE) for skin cancer screening and mole check.  The following portions of the chart were reviewed this encounter and updated as appropriate:  Tobacco  Allergies  Meds  Problems  Med Hx  Surg Hx  Fam Hx     Review of Systems:  No other skin or systemic complaints except as noted in HPI or Assessment and Plan.  Objective  Well appearing patient in no apparent distress; mood and affect are within normal limits.  A full examination was performed including scalp, head, eyes, ears, nose, lips, neck, chest, axillae, abdomen, back, buttocks, bilateral upper extremities, bilateral lower extremities, hands, feet, fingers, toes, fingernails, and toenails. All findings within normal limits unless otherwise noted below.  Objective  multiple: Well healed scars with no evidence of recurrence.   Objective  multiple: Scars with no evidence of recurrence.   Objective  L lat brow x 1, L temple x 1 (2): Pink scaly macules   Objective  R medial infraorbital x 1, R inner superior helix x 1, L dorsum foot x 1 (3): Erythematous keratotic pap R medial infraorbital, L dorsum foot  Erythematous keratotic pap overlying white pap R inner superior helix  Objective  arms, legs: Pink paps, some with micropustules   Assessment & Plan    Lentigines - Scattered tan macules - Discussed due to sun exposure - Benign, observe - Call for any changes  Seborrheic Keratoses - Stuck-on, waxy, tan-brown papules and plaques  - Discussed benign etiology and prognosis. - Observe - Call for any changes  Melanocytic Nevi - Tan-brown and/or pink-flesh-colored symmetric macules and papules - Benign appearing on exam today - Observation -  Call clinic for new or changing moles - Recommend daily use of broad spectrum spf 30+ sunscreen to sun-exposed areas.   Hemangiomas - Red papules - Discussed benign nature - Observe - Call for any changes  Actinic Damage - diffuse scaly erythematous macules with underlying dyspigmentation - Recommend daily broad spectrum sunscreen SPF 30+ to sun-exposed areas, reapply every 2 hours as needed.  - Call for new or changing lesions.  Skin cancer screening performed today.  History of basal cell carcinoma (BCC) multiple  Clear. Observe for recurrence. Call clinic for new or changing lesions.  Recommend regular skin exams, daily broad-spectrum spf 30+ sunscreen use, and photoprotection.     History of dysplastic nevus multiple  Clear, observe for changes   AK (actinic keratosis) (2) L lat brow x 1, L temple x 1  Destruction of lesion - L lat brow x 1, L temple x 1 Complexity: simple   Destruction method: cryotherapy   Informed consent: discussed and consent obtained   Timeout:  patient name, date of birth, surgical site, and procedure verified Lesion destroyed using liquid nitrogen: Yes   Region frozen until ice ball extended beyond lesion: Yes   Outcome: patient tolerated procedure well with no complications   Post-procedure details: wound care instructions given    Inflamed seborrheic keratosis (3) R medial infraorbital x 1, R inner superior helix x 1, L dorsum foot x 1  ISK R medial infraorbital, L dorsum foot  Ln2 x 2 today  ISK overlying Milia R inner superior helix, Ln2 x 1  Destruction  of lesion - R medial infraorbital x 1, R inner superior helix x 1, L dorsum foot x 1 Complexity: simple   Destruction method: cryotherapy   Informed consent: discussed and consent obtained   Timeout:  patient name, date of birth, surgical site, and procedure verified Lesion destroyed using liquid nitrogen: Yes   Region frozen until ice ball extended beyond lesion: Yes   Outcome:  patient tolerated procedure well with no complications   Post-procedure details: wound care instructions given    Folliculitis arms, legs  Vs Bite Rxn  Start CLN Sports wash qd Start TMC 0.1% cr qd up to 5d/wk to spot txt areas arms, legs, avoid face, groin, axilla  Pt to call in 2 weeks if not improving.    triamcinolone cream (KENALOG) 0.1 % - arms, legs  Skin cancer screening  Return in about 2 months (around 02/25/2020) for Recheck AK's, ISK's, pt to call in 2 weeks if rash not improving.  I, Othelia Pulling, RMA, am acting as scribe for Sarina Ser, MD .  Documentation: I have reviewed the above documentation for accuracy and completeness, and I agree with the above.  Sarina Ser, MD

## 2019-12-29 ENCOUNTER — Encounter: Payer: Self-pay | Admitting: Dermatology

## 2020-02-19 ENCOUNTER — Other Ambulatory Visit: Payer: Self-pay | Admitting: Family Medicine

## 2020-02-19 DIAGNOSIS — Z1231 Encounter for screening mammogram for malignant neoplasm of breast: Secondary | ICD-10-CM

## 2020-02-23 ENCOUNTER — Encounter: Payer: Self-pay | Admitting: Dermatology

## 2020-02-23 ENCOUNTER — Other Ambulatory Visit: Payer: Self-pay

## 2020-02-23 ENCOUNTER — Ambulatory Visit: Payer: Medicare PPO | Admitting: Dermatology

## 2020-02-23 DIAGNOSIS — L570 Actinic keratosis: Secondary | ICD-10-CM

## 2020-02-23 DIAGNOSIS — L308 Other specified dermatitis: Secondary | ICD-10-CM

## 2020-02-23 DIAGNOSIS — S40861A Insect bite (nonvenomous) of right upper arm, initial encounter: Secondary | ICD-10-CM

## 2020-02-23 DIAGNOSIS — S80861A Insect bite (nonvenomous), right lower leg, initial encounter: Secondary | ICD-10-CM

## 2020-02-23 DIAGNOSIS — S80862A Insect bite (nonvenomous), left lower leg, initial encounter: Secondary | ICD-10-CM

## 2020-02-23 DIAGNOSIS — L689 Hypertrichosis, unspecified: Secondary | ICD-10-CM

## 2020-02-23 DIAGNOSIS — L739 Follicular disorder, unspecified: Secondary | ICD-10-CM | POA: Diagnosis not present

## 2020-02-23 DIAGNOSIS — L820 Inflamed seborrheic keratosis: Secondary | ICD-10-CM

## 2020-02-23 DIAGNOSIS — S40862A Insect bite (nonvenomous) of left upper arm, initial encounter: Secondary | ICD-10-CM

## 2020-02-23 DIAGNOSIS — W57XXXA Bitten or stung by nonvenomous insect and other nonvenomous arthropods, initial encounter: Secondary | ICD-10-CM

## 2020-02-23 MED ORDER — TRIAMCINOLONE ACETONIDE 0.1 % EX CREA: 1.0000 "application " | TOPICAL_CREAM | CUTANEOUS | 1 refills | Status: AC

## 2020-02-23 MED ORDER — VANIQA 13.9 % EX CREA: TOPICAL_CREAM | CUTANEOUS | 2 refills | Status: AC

## 2020-02-23 NOTE — Progress Notes (Signed)
Follow-Up Visit   Subjective  Teresa Estes is a 74 y.o. female who presents for the following: Follow-up. Patient here for AK, ISK and rash follow up. AK's at the left lat brow and left temple treated 2 months ago with LN2. ISK's at the left dorsum foot, right inner superior helix and right medial infraorbital treated 2 months ago with LN2. The spot at left dorsum foot gets red and burns when she showers.  Folliculitis vs Bite reaction at the legs that patient is using TMC 0.1% cream on but advises she is still getting an occasional new spot that itches, mostly at upper legs.  The following portions of the chart were reviewed this encounter and updated as appropriate:  Tobacco  Allergies  Meds  Problems  Med Hx  Surg Hx  Fam Hx     Review of Systems:  No other skin or systemic complaints except as noted in HPI or Assessment and Plan.  Objective  Well appearing patient in no apparent distress; mood and affect are within normal limits.  A focused examination was performed including face, legs, left foot, arms, chest. Relevant physical exam findings are noted in the Assessment and Plan.  Objective  Left lateral brow x 1: Erythematous thin papules/macules with gritty scale.   Objective  Left dorsum foot: Pink patch  Objective  Arms, legs: Pink papule right arm, legs are mostly clear  Objective  Right superior popliteal x 1, chest x 10 (11): Erythematous keratotic or waxy stuck-on papule or plaque.    Assessment & Plan  AK (actinic keratosis) Left lateral brow x 1  Destruction of lesion - Left lateral brow x 1 Complexity: simple   Destruction method: cryotherapy   Informed consent: discussed and consent obtained   Timeout:  patient name, date of birth, surgical site, and procedure verified Lesion destroyed using liquid nitrogen: Yes   Region frozen until ice ball extended beyond lesion: Yes   Outcome: patient tolerated procedure well with no complications     Post-procedure details: wound care instructions given    Other eczema Left dorsum foot  Start TMC 0.1% cream daily up to 5 days a week as needed for rash. Avoid applying to face, groin, and axilla. Use as directed. Risk of skin atrophy with long-term use reviewed.   Side effects of Otezla (apremilast) include diarrhea, nausea, headache, upper respiratory infection, depression, and weight decrease (5-10%). It should only be taken by pregnant women after a discussion regarding risks and benefits with their doctor.   Insect bite Arms, legs  Cont TMC 0.1% up to 5 days a week as needed for rash/itch. Avoid applying to face, groin, and axilla. Use as directed. Risk of skin atrophy with long-term use reviewed.   Topical steroids (such as triamcinolone, fluocinolone, fluocinonide, mometasone, clobetasol, halobetasol, betamethasone, hydrocortisone) can cause thinning and lightening of the skin if they are used for too long in the same area. Your physician has selected the right strength medicine for your problem and area affected on the body. Please use your medication only as directed by your physician to prevent side effects.    Inflamed seborrheic keratosis (11) Right superior popliteal x 1, chest x 10  Destruction of lesion - Right superior popliteal x 1, chest x 10 Complexity: simple   Destruction method: cryotherapy   Informed consent: discussed and consent obtained   Timeout:  patient name, date of birth, surgical site, and procedure verified Lesion destroyed using liquid nitrogen: Yes   Region  frozen until ice ball extended beyond lesion: Yes   Outcome: patient tolerated procedure well with no complications   Post-procedure details: wound care instructions given    Reordered Medications triamcinolone cream (KENALOG) 0.1 %  Excessive hair growth face Discussed treatment options. Will start Vaniqa twice daily. This slows hair growth but does not remove hair. She may shave or tweeze  hair and use Vaniqa  Ordered Medications: Eflornithine HCl (VANIQA) 13.9 % cream  Return in about 6 months (around 08/22/2020).  Graciella Belton, RMA, am acting as scribe for Sarina Ser, MD . Documentation: I have reviewed the above documentation for accuracy and completeness, and I agree with the above.  Sarina Ser, MD

## 2020-02-23 NOTE — Patient Instructions (Signed)
Cryotherapy Aftercare  . Wash gently with soap and water everyday.   . Apply Vaseline and Band-Aid daily until healed.  Topical steroids (such as triamcinolone, fluocinolone, fluocinonide, mometasone, clobetasol, halobetasol, betamethasone, hydrocortisone) can cause thinning and lightening of the skin if they are used for too long in the same area. Your physician has selected the right strength medicine for your problem and area affected on the body. Please use your medication only as directed by your physician to prevent side effects.    

## 2020-03-01 ENCOUNTER — Other Ambulatory Visit: Payer: Self-pay

## 2020-03-01 ENCOUNTER — Ambulatory Visit
Admission: RE | Admit: 2020-03-01 | Discharge: 2020-03-01 | Disposition: A | Discharge status: Home / Self Care | Payer: Medicare PPO | Source: Ambulatory Visit | Attending: Family Medicine | Admitting: Family Medicine

## 2020-03-01 DIAGNOSIS — Z1231 Encounter for screening mammogram for malignant neoplasm of breast: Secondary | ICD-10-CM | POA: Diagnosis present

## 2020-08-09 ENCOUNTER — Ambulatory Visit: Payer: Medicare PPO | Admitting: Dermatology

## 2020-09-07 ENCOUNTER — Other Ambulatory Visit: Payer: Self-pay

## 2020-09-07 ENCOUNTER — Ambulatory Visit: Payer: Medicare PPO | Admitting: Dermatology

## 2020-09-07 ENCOUNTER — Encounter: Payer: Self-pay | Admitting: Dermatology

## 2020-09-07 DIAGNOSIS — Z85828 Personal history of other malignant neoplasm of skin: Secondary | ICD-10-CM | POA: Diagnosis not present

## 2020-09-07 DIAGNOSIS — I8393 Asymptomatic varicose veins of bilateral lower extremities: Secondary | ICD-10-CM | POA: Diagnosis not present

## 2020-09-07 DIAGNOSIS — L578 Other skin changes due to chronic exposure to nonionizing radiation: Secondary | ICD-10-CM

## 2020-09-07 DIAGNOSIS — D229 Melanocytic nevi, unspecified: Secondary | ICD-10-CM

## 2020-09-07 DIAGNOSIS — Z1283 Encounter for screening for malignant neoplasm of skin: Secondary | ICD-10-CM

## 2020-09-07 DIAGNOSIS — L821 Other seborrheic keratosis: Secondary | ICD-10-CM

## 2020-09-07 DIAGNOSIS — Z86018 Personal history of other benign neoplasm: Secondary | ICD-10-CM

## 2020-09-07 DIAGNOSIS — L814 Other melanin hyperpigmentation: Secondary | ICD-10-CM

## 2020-09-07 DIAGNOSIS — D1800 Hemangioma unspecified site: Secondary | ICD-10-CM

## 2020-09-07 NOTE — Patient Instructions (Signed)

## 2020-09-07 NOTE — Progress Notes (Signed)
   Follow-Up Visit   Subjective  Teresa Estes is a 75 y.o. female who presents for the following: Annual Exam (Hx BCC, dysplastic nevi ). The patient presents for Total-Body Skin Exam (TBSE) for skin cancer screening and mole check.  The following portions of the chart were reviewed this encounter and updated as appropriate:   Tobacco  Allergies  Meds  Problems  Med Hx  Surg Hx  Fam Hx     Review of Systems:  No other skin or systemic complaints except as noted in HPI or Assessment and Plan.  Objective  Well appearing patient in no apparent distress; mood and affect are within normal limits.  A full examination was performed including scalp, head, eyes, ears, nose, lips, neck, chest, axillae, abdomen, back, buttocks, bilateral upper extremities, bilateral lower extremities, hands, feet, fingers, toes, fingernails, and toenails. All findings within normal limits unless otherwise noted below.  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  Observation.  Call clinic for new or changing lesions.  Recommend daily use of broad spectrum spf 30+ sunscreen to sun-exposed areas.    Skin cancer screening   Lentigines - Scattered tan macules - Due to sun exposure - Benign-appering, observe - Recommend daily broad spectrum sunscreen SPF 30+ to sun-exposed areas, reapply every 2 hours as needed. - Call for any changes  Seborrheic Keratoses - Stuck-on, waxy, tan-brown papules and/or plaques  - Benign-appearing - Discussed benign etiology and prognosis. - Observe - Call for any changes  Melanocytic Nevi - Tan-brown and/or pink-flesh-colored symmetric macules and papules - Benign appearing on exam today - Observation - Call clinic for new or changing moles - Recommend daily use of broad spectrum spf 30+ sunscreen to sun-exposed areas.   Hemangiomas - Red papules - Discussed benign  nature - Observe - Call for any changes  Actinic Damage - Chronic condition, secondary to cumulative UV/sun exposure - diffuse scaly erythematous macules with underlying dyspigmentation - Recommend daily broad spectrum sunscreen SPF 30+ to sun-exposed areas, reapply every 2 hours as needed.  - Staying in the shade or wearing long sleeves, sun glasses (UVA+UVB protection) and wide brim hats (4-inch brim around the entire circumference of the hat) are also recommended for sun protection.  - Call for new or changing lesions.  History of Basal Cell Carcinoma of the Skin - No evidence of recurrence today - Recommend regular full body skin exams - Recommend daily broad spectrum sunscreen SPF 30+ to sun-exposed areas, reapply every 2 hours as needed.  - Call if any new or changing lesions are noted between office visits  History of Dysplastic Nevi - No evidence of recurrence today - Recommend regular full body skin exams - Recommend daily broad spectrum sunscreen SPF 30+ to sun-exposed areas, reapply every 2 hours as needed.  - Call if any new or changing lesions are noted between office visits  Skin cancer screening performed today.  Return in about 1 year (around 09/07/2021) for TBSE.  Luther Redo, CMA, am acting as scribe for Sarina Ser, MD .  Documentation: I have reviewed the above documentation for accuracy and completeness, and I agree with the above.  Sarina Ser, MD

## 2020-09-13 ENCOUNTER — Ambulatory Visit: Payer: Medicare PPO | Admitting: Dermatology

## 2020-09-24 ENCOUNTER — Other Ambulatory Visit: Payer: Self-pay | Admitting: Family Medicine

## 2020-09-24 ENCOUNTER — Other Ambulatory Visit (HOSPITAL_COMMUNITY): Payer: Self-pay | Admitting: Family Medicine

## 2020-09-24 DIAGNOSIS — R0789 Other chest pain: Secondary | ICD-10-CM

## 2020-10-12 ENCOUNTER — Other Ambulatory Visit: Payer: Self-pay

## 2020-10-12 ENCOUNTER — Ambulatory Visit
Admission: RE | Admit: 2020-10-12 | Discharge: 2020-10-12 | Disposition: A | Discharge status: Home / Self Care | Payer: Medicare PPO | Source: Ambulatory Visit | Attending: Family Medicine | Admitting: Family Medicine

## 2020-10-12 DIAGNOSIS — R0789 Other chest pain: Secondary | ICD-10-CM | POA: Diagnosis not present

## 2021-02-16 ENCOUNTER — Ambulatory Visit: Payer: Medicare PPO | Admitting: Dermatology

## 2021-02-22 ENCOUNTER — Other Ambulatory Visit: Payer: Self-pay | Admitting: Family Medicine

## 2021-02-22 DIAGNOSIS — Z1231 Encounter for screening mammogram for malignant neoplasm of breast: Secondary | ICD-10-CM

## 2021-03-14 ENCOUNTER — Ambulatory Visit
Admission: RE | Admit: 2021-03-14 | Discharge: 2021-03-14 | Disposition: A | Discharge status: Home / Self Care | Payer: Medicare PPO | Source: Ambulatory Visit | Attending: Family Medicine | Admitting: Family Medicine

## 2021-03-14 ENCOUNTER — Other Ambulatory Visit: Payer: Self-pay

## 2021-03-14 DIAGNOSIS — Z1231 Encounter for screening mammogram for malignant neoplasm of breast: Secondary | ICD-10-CM | POA: Insufficient documentation

## 2021-05-09 ENCOUNTER — Other Ambulatory Visit: Payer: Self-pay

## 2021-05-09 ENCOUNTER — Ambulatory Visit
Admission: EM | Admit: 2021-05-09 | Discharge: 2021-05-09 | Disposition: A | Discharge status: Home / Self Care | Payer: Medicare PPO | Attending: Emergency Medicine | Admitting: Emergency Medicine

## 2021-05-09 DIAGNOSIS — B349 Viral infection, unspecified: Secondary | ICD-10-CM

## 2021-05-09 DIAGNOSIS — R110 Nausea: Secondary | ICD-10-CM

## 2021-05-09 DIAGNOSIS — J029 Acute pharyngitis, unspecified: Secondary | ICD-10-CM | POA: Diagnosis not present

## 2021-05-09 DIAGNOSIS — R42 Dizziness and giddiness: Secondary | ICD-10-CM | POA: Diagnosis not present

## 2021-05-09 MED ORDER — MECLIZINE HCL 25 MG PO TABS: 25.0000 mg | ORAL_TABLET | Freq: Three times a day (TID) | ORAL | 0 refills | Status: AC | PRN

## 2021-05-09 MED ORDER — ONDANSETRON HCL 4 MG PO TABS: 4.0000 mg | ORAL_TABLET | Freq: Four times a day (QID) | ORAL | 0 refills | Status: AC

## 2021-05-09 MED ORDER — PREDNISONE 10 MG (21) PO TBPK: ORAL_TABLET | Freq: Every day | ORAL | 0 refills | Status: AC

## 2021-05-09 NOTE — Discharge Instructions (Addendum)
We will treat the symptoms but it can take up to a week for symptoms to resolve completely. Patient will need to continue to take over-the-counter medications as needed. If symptoms become worse in the next 24 hours patient needs to be seen in the emergency room for further treatment. Avoid any medications with decongestants that may cause an  elevation in blood pressure. Meclizine can cause some sleepiness do not drive while taking this medication

## 2021-05-09 NOTE — ED Triage Notes (Signed)
Patient presents to Urgent Care with complaints of sore throat and runny nose x 1 week. She did develop some ear pressure yesterday. Today she started feeling dizzy, having nausea, and vomiting. Treating symptoms with throat lozenges, tylenol, and gargling salt water with no improvement.  Denies fever.

## 2021-05-09 NOTE — ED Provider Notes (Signed)
[facility]    CSN: [id number] Arrival date & time: 05/09/21  0913      History   Chief Complaint Chief Complaint  Patient presents with   Nausea   Cough    HPI Teresa Estes is a 75 y.o. female.   Patient is here today for dizziness some nausea and vomiting with the dizziness.  Sore throat and slight congestion.  Patient's husband had the same symptoms 1 week ago and now she has been in with those same symptoms.  Denies any cough no fever.  Patient has been taking over-the-counter medications throat lozenge or's gargling with salt water and taking honey with no relief.  Patient denies any chest pain or shortness of breath.   Past Medical History:  Diagnosis Date   Abnormal uterine bleeding    Basal cell carcinoma 06/26/2007   Right back above bra line. Superficial.    Basal cell carcinoma 10/23/2013   Right mid back. Superficial.    Basal cell carcinoma 06/22/2014   Right popliteal. Nodular pattern.    Basal cell carcinoma 07/12/2015   Right back above bra line. Residual BCC overlying scar, lateral margin involved.   Breast cancer ([place]) 2010   lumpectomy with mammosite rad tx   Cancer [facility])    Right breast cancer 2009 with lumpectomy and rad tx   Dysplastic nevus 08/01/2017   Right scapula. Mild atypia, lateral and deep margins involved.   Dysplastic nevus 08/01/2017   Left inf. med. scapula. Mild atypia, deep margin involved.   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   HLD (hyperlipidemia) 08/06/2015   BP (high blood pressure) 08/06/2015   Arthritis, degenerative 08/06/2015    Past Surgical History:  Procedure Laterality Date   BREAST CYST ASPIRATION Left yrs ago   neg   BREAST LUMPECTOMY Right 2010   lumpectomy with mammosite rad tx   BREAST LUMPECTOMY WITH AXILLARY LYMPH NODE  BIOPSY AND MAMMOSITE CAVITY EVALUATION DEVICE Right 2010   CHOLECYSTECTOMY N/A 06/13/2019   Procedure: LAPAROSCOPIC CHOLECYSTECTOMY WITH INTRAOPERATIVE CHOLANGIOGRAM;  Surgeon: Robert Bellow, MD;  Location: [facility];  Service: General;  Laterality: N/A;   [place] N/A 04/04/2019   Procedure: Myton;  Surgeon: Ward, Honor Loh, MD;  Location: [facility];  Service: Gynecology;  Laterality: N/A;   DILATION AND CURETTAGE OF UTERUS     TUBAL LIGATION      OB History   No obstetric history on file.      Home Medications    Prior to Admission medications   Medication Sig Start Date End Date Taking? Authorizing Provider  meclizine (ANTIVERT) 25 MG tablet Take 1 tablet (25 mg total) by mouth 3 (three) times daily as needed for dizziness. 05/09/21  Yes Marney Setting, NP  ondansetron (ZOFRAN) 4 MG tablet Take 1 tablet (4 mg total) by mouth every 6 (six) hours. 05/09/21  Yes Marney Setting, NP  predniSONE (STERAPRED UNI-PAK 21 TAB) 10 MG (21) TBPK tablet Take by mouth daily. Take 6 tabs by mouth daily  for 2 days, then 5 tabs for 2 days, then 4 tabs for 2 days, then 3 tabs for 2 days, 2 tabs for 2 days, then 1 tab by mouth daily for 2 days 05/09/21  Yes Alroy Dust,  Gianny Killman L, NP  acetaminophen (TYLENOL) 500 MG tablet Take 1,000 mg by mouth every 6 (six) hours as needed for moderate pain.     [provider]  Calcium Carb-Cholecalciferol (CALCIUM-VITAMIN D) 600-400 MG-UNIT TABS Take 1 tablet by mouth daily.     [provider]  dicyclomine (BENTYL) 20 MG tablet Take 20 mg by mouth 3 (three) times daily as needed for spasms.  05/07/19   [provider]  Eflornithine HCl (VANIQA) 13.9 % cream Apply twice daily 02/23/20   Ralene Bathe, MD  lisinopril (PRINIVIL,ZESTRIL) 40 MG tablet Take 40 mg by mouth daily.  12/23/14   [provider]  lovastatin (MEVACOR) 10 MG tablet Take 10  mg by mouth daily with supper.  12/23/14   [provider]  Multiple Vitamin (MULTI-VITAMINS) TABS Take 1 tablet by mouth daily.     [provider]  Omega-3 Fatty Acids (FISH OIL) 1000 MG CAPS Take 1,000 mg by mouth daily.    [provider]  triamcinolone cream (KENALOG) 0.1 % Apply 1 application topically as directed. Qd up to 5 days a week to aa itchy bumps on arms and legs, avoid face, groin, axilla 02/23/20   Ralene Bathe, MD    Family History Family History  Problem Relation Age of Onset   Breast cancer Daughter 29    Social History Social History   Tobacco Use   Smoking status: Never   Smokeless tobacco: Never  Vaping Use   Vaping Use: Never used  Substance Use Topics   Alcohol use: Never   Drug use: Never     Allergies   Sulfa antibiotics   Review of Systems Review of Systems  Constitutional:  Negative for chills, fatigue and fever.  HENT:  Positive for postnasal drip, rhinorrhea and sore throat.   Eyes: Negative.   Respiratory:  Negative for cough and shortness of breath.   Cardiovascular: Negative.   Gastrointestinal:  Positive for nausea and vomiting. Negative for abdominal pain.  Genitourinary: Negative.   Skin: Negative.   Neurological:  Positive for dizziness. Negative for weakness.    Physical Exam Triage Vital Signs ED Triage Vitals  Enc Vitals Group     BP 05/09/21 1004 136/72     Pulse Rate 05/09/21 1004 63     Resp 05/09/21 1004 16     Temp 05/09/21 1004 98.2 F (36.8 C)     Temp Source 05/09/21 1004 Oral     SpO2 05/09/21 1004 100 %     Weight --      Height --      Head Circumference --      Peak Flow --      Pain Score 05/09/21 1003 7     Pain Loc --      Pain Edu? --      Excl. in [place]? --    No data found.  Updated Vital Signs BP 136/72 (BP Location: Left Arm)   Pulse 63   Temp 98.2 F (36.8 C) (Oral)   Resp 16   SpO2 100%   Visual Acuity Right Eye Distance:   Left Eye Distance:    Bilateral Distance:    Right Eye Near:   Left Eye Near:    Bilateral Near:     Physical Exam Constitutional:      Appearance: Normal appearance.  HENT:     Right Ear: A middle ear effusion is present.     Left Ear: A middle ear  effusion is present.     Nose: Congestion present.     Mouth/Throat:     Pharynx: Posterior oropharyngeal erythema present.  Eyes:     Pupils: Pupils are equal, round, and reactive to light.  Cardiovascular:     Rate and Rhythm: Normal rate.  Pulmonary:     Effort: Pulmonary effort is normal.  Abdominal:     General: Abdomen is flat.     Tenderness: There is no right CVA tenderness or left CVA tenderness.  Musculoskeletal:     Cervical back: Normal range of motion.  Skin:    General: Skin is warm.  Neurological:     General: No focal deficit present.     Mental Status: She is alert. Mental status is at baseline.     UC Treatments / Results  Labs (all labs ordered are listed, but only abnormal results are displayed) Labs Reviewed - No data to display  EKG   Radiology No results found.  Procedures Procedures (including critical care time)  Medications Ordered in UC Medications - No data to display  Initial Impression / Assessment and Plan / UC Course  I have reviewed the triage vital signs and the nursing notes.  Pertinent labs & imaging results that were available during my care of the patient were reviewed by me and considered in my medical decision making (see chart for details).     Expressed to patient that it appears more viral in nature and antibiotics are not needed at this time. We will treat the symptoms but it can take up to a week for symptoms to resolve completely. Patient will need to continue to take over-the-counter medications as needed. If symptoms become worse in the next 24 hours patient needs to be seen in the emergency room for further treatment. Avoid any medications with decongestants that may cause an  elevation in blood pressure.  Final Clinical Impressions(s) / UC Diagnoses   Final diagnoses:  Dizziness  Acute pharyngitis, unspecified etiology  Nausea  Viral illness     Discharge Instructions      We will treat the symptoms but it can take up to a week for symptoms to resolve completely. Patient will need to continue to take over-the-counter medications as needed. If symptoms become worse in the next 24 hours patient needs to be seen in the emergency room for further treatment. Avoid any medications with decongestants that may cause an  elevation in blood pressure. Meclizine can cause some sleepiness do not drive while taking this medication     ED Prescriptions     Medication Sig Dispense Auth. Provider   meclizine (ANTIVERT) 25 MG tablet Take 1 tablet (25 mg total) by mouth 3 (three) times daily as needed for dizziness. 30 tablet Morley Kos L, NP   predniSONE (STERAPRED UNI-PAK 21 TAB) 10 MG (21) TBPK tablet Take by mouth daily. Take 6 tabs by mouth daily  for 2 days, then 5 tabs for 2 days, then 4 tabs for 2 days, then 3 tabs for 2 days, 2 tabs for 2 days, then 1 tab by mouth daily for 2 days 42 tablet Morley Kos L, NP   ondansetron (ZOFRAN) 4 MG tablet Take 1 tablet (4 mg total) by mouth every 6 (six) hours. 12 tablet Marney Setting, NP      PDMP not reviewed this encounter.   Marney Setting, NP 05/09/21 1030

## 2021-09-08 ENCOUNTER — Encounter: Payer: Medicare PPO | Admitting: Dermatology

## 2021-11-08 ENCOUNTER — Telehealth: Payer: Self-pay

## 2021-11-08 NOTE — Telephone Encounter (Signed)
Pt called requesting copies of her pathology results be mailed to her, okay mailed pt's pathology report to her.

## 2022-01-02 ENCOUNTER — Ambulatory Visit: Payer: Medicare PPO | Admitting: Dermatology

## 2022-02-14 ENCOUNTER — Other Ambulatory Visit: Payer: Self-pay | Admitting: Family Medicine

## 2022-02-14 DIAGNOSIS — Z1231 Encounter for screening mammogram for malignant neoplasm of breast: Secondary | ICD-10-CM

## 2022-02-28 DIAGNOSIS — I10 Essential (primary) hypertension: Secondary | ICD-10-CM | POA: Diagnosis not present

## 2022-02-28 DIAGNOSIS — E7801 Familial hypercholesterolemia: Secondary | ICD-10-CM | POA: Diagnosis not present

## 2022-03-07 DIAGNOSIS — Z853 Personal history of malignant neoplasm of breast: Secondary | ICD-10-CM | POA: Diagnosis not present

## 2022-03-07 DIAGNOSIS — N39490 Overflow incontinence: Secondary | ICD-10-CM | POA: Diagnosis not present

## 2022-03-07 DIAGNOSIS — F418 Other specified anxiety disorders: Secondary | ICD-10-CM | POA: Diagnosis not present

## 2022-03-07 DIAGNOSIS — I10 Essential (primary) hypertension: Secondary | ICD-10-CM | POA: Diagnosis not present

## 2022-03-07 DIAGNOSIS — M8580 Other specified disorders of bone density and structure, unspecified site: Secondary | ICD-10-CM | POA: Diagnosis not present

## 2022-03-07 DIAGNOSIS — R0789 Other chest pain: Secondary | ICD-10-CM | POA: Diagnosis not present

## 2022-03-07 DIAGNOSIS — E7801 Familial hypercholesterolemia: Secondary | ICD-10-CM | POA: Diagnosis not present

## 2022-03-07 DIAGNOSIS — I700 Atherosclerosis of aorta: Secondary | ICD-10-CM | POA: Diagnosis not present

## 2022-03-07 DIAGNOSIS — Z780 Asymptomatic menopausal state: Secondary | ICD-10-CM | POA: Diagnosis not present

## 2022-03-20 ENCOUNTER — Ambulatory Visit
Admission: RE | Admit: 2022-03-20 | Discharge: 2022-03-20 | Disposition: A | Discharge status: Home / Self Care | Payer: Medicare PPO | Source: Ambulatory Visit | Attending: Family Medicine | Admitting: Family Medicine

## 2022-03-20 DIAGNOSIS — Z1231 Encounter for screening mammogram for malignant neoplasm of breast: Secondary | ICD-10-CM | POA: Diagnosis not present

## 2022-03-23 ENCOUNTER — Other Ambulatory Visit: Payer: Self-pay | Admitting: Family Medicine

## 2022-03-23 DIAGNOSIS — N63 Unspecified lump in unspecified breast: Secondary | ICD-10-CM

## 2022-03-23 DIAGNOSIS — R928 Other abnormal and inconclusive findings on diagnostic imaging of breast: Secondary | ICD-10-CM

## 2022-04-03 DIAGNOSIS — M8588 Other specified disorders of bone density and structure, other site: Secondary | ICD-10-CM | POA: Diagnosis not present

## 2022-04-05 ENCOUNTER — Ambulatory Visit
Admission: RE | Admit: 2022-04-05 | Discharge: 2022-04-05 | Disposition: A | Discharge status: Home / Self Care | Payer: Medicare PPO | Source: Ambulatory Visit | Attending: Family Medicine | Admitting: Family Medicine

## 2022-04-05 ENCOUNTER — Other Ambulatory Visit: Payer: Self-pay | Admitting: Family Medicine

## 2022-04-05 DIAGNOSIS — N63 Unspecified lump in unspecified breast: Secondary | ICD-10-CM | POA: Diagnosis not present

## 2022-04-05 DIAGNOSIS — R928 Other abnormal and inconclusive findings on diagnostic imaging of breast: Secondary | ICD-10-CM

## 2022-04-05 DIAGNOSIS — R590 Localized enlarged lymph nodes: Secondary | ICD-10-CM | POA: Diagnosis not present

## 2022-04-10 ENCOUNTER — Encounter: Payer: Self-pay | Admitting: Dermatology

## 2022-04-10 ENCOUNTER — Other Ambulatory Visit: Payer: Self-pay | Admitting: Family Medicine

## 2022-04-10 ENCOUNTER — Ambulatory Visit: Payer: Medicare PPO | Admitting: Dermatology

## 2022-04-10 DIAGNOSIS — L578 Other skin changes due to chronic exposure to nonionizing radiation: Secondary | ICD-10-CM | POA: Diagnosis not present

## 2022-04-10 DIAGNOSIS — L820 Inflamed seborrheic keratosis: Secondary | ICD-10-CM

## 2022-04-10 DIAGNOSIS — L814 Other melanin hyperpigmentation: Secondary | ICD-10-CM | POA: Diagnosis not present

## 2022-04-10 DIAGNOSIS — R599 Enlarged lymph nodes, unspecified: Secondary | ICD-10-CM

## 2022-04-10 DIAGNOSIS — Z85828 Personal history of other malignant neoplasm of skin: Secondary | ICD-10-CM

## 2022-04-10 DIAGNOSIS — Z1283 Encounter for screening for malignant neoplasm of skin: Secondary | ICD-10-CM | POA: Diagnosis not present

## 2022-04-10 DIAGNOSIS — L821 Other seborrheic keratosis: Secondary | ICD-10-CM | POA: Diagnosis not present

## 2022-04-10 DIAGNOSIS — L0100 Impetigo, unspecified: Secondary | ICD-10-CM

## 2022-04-10 DIAGNOSIS — Z86018 Personal history of other benign neoplasm: Secondary | ICD-10-CM

## 2022-04-10 DIAGNOSIS — R928 Other abnormal and inconclusive findings on diagnostic imaging of breast: Secondary | ICD-10-CM

## 2022-04-10 DIAGNOSIS — D229 Melanocytic nevi, unspecified: Secondary | ICD-10-CM | POA: Diagnosis not present

## 2022-04-10 DIAGNOSIS — Z853 Personal history of malignant neoplasm of breast: Secondary | ICD-10-CM

## 2022-04-10 DIAGNOSIS — Z79899 Other long term (current) drug therapy: Secondary | ICD-10-CM

## 2022-04-10 MED ORDER — MUPIROCIN 2 % EX OINT: TOPICAL_OINTMENT | CUTANEOUS | 0 refills | Status: AC

## 2022-04-10 NOTE — Patient Instructions (Addendum)
Cryotherapy Aftercare  Wash gently with soap and water everyday.   Apply Vaseline daily until healed.    Recommend daily broad spectrum sunscreen SPF 30+ to sun-exposed areas, reapply every 2 hours as needed. Call for new or changing lesions.  Staying in the shade or wearing long sleeves, sun glasses (UVA+UVB protection) and wide brim hats (4-inch brim around the entire circumference of the hat) are also recommended for sun protection.    Melanoma ABCDEs  Melanoma is the most dangerous type of skin cancer, and is the leading cause of death from skin disease.  You are more likely to develop melanoma if you: Have light-colored skin, light-colored eyes, or red or blond hair Spend a lot of time in the sun Tan regularly, either outdoors or in a tanning bed Have had blistering sunburns, especially during childhood Have a close family member who has had a melanoma Have atypical moles or large birthmarks  Early detection of melanoma is key since treatment is typically straightforward and cure rates are extremely high if we catch it early.   The first sign of melanoma is often a change in a mole or a new dark spot.  The ABCDE system is a way of remembering the signs of melanoma.  A for asymmetry:  The two halves do not match. B for border:  The edges of the growth are irregular. C for color:  A mixture of colors are present instead of an even brown color. D for diameter:  Melanomas are usually (but not always) greater than 6mm - the size of a pencil eraser. E for evolution:  The spot keeps changing in size, shape, and color.  Please check your skin once per month between visits. You can use a small mirror in front and a large mirror behind you to keep an eye on the back side or your body.   If you see any new or changing lesions before your next follow-up, please call to schedule a visit.  Please continue daily skin protection including broad spectrum sunscreen SPF 30+ to sun-exposed areas,  reapplying every 2 hours as needed when you're outdoors.   Staying in the shade or wearing long sleeves, sun glasses (UVA+UVB protection) and wide brim hats (4-inch brim around the entire circumference of the hat) are also recommended for sun protection.       Due to recent changes in healthcare laws, you may see results of your pathology and/or laboratory studies on MyChart before the doctors have had a chance to review them. We understand that in some cases there may be results that are confusing or concerning to you. Please understand that not all results are received at the same time and often the doctors may need to interpret multiple results in order to provide you with the best plan of care or course of treatment. Therefore, we ask that you please give us 2 business days to thoroughly review all your results before contacting the office for clarification. Should we see a critical lab result, you will be contacted sooner.   If You Need Anything After Your Visit  If you have any questions or concerns for your doctor, please call our main line at [phone] and press option 4 to reach your doctor's medical assistant. If no one answers, please leave a voicemail as directed and we will return your call as soon as possible. Messages left after 4 pm will be answered the following business day.   You may also send us a message   via MyChart. We typically respond to MyChart messages within 1-2 business days.  For prescription refills, please ask your pharmacy to contact our office. Our fax number is [phone].  If you have an urgent issue when the clinic is closed that cannot wait until the next business day, you can page your doctor at the number below.    Please note that while we do our best to be available for urgent issues outside of office hours, we are not available 24/7.   If you have an urgent issue and are unable to reach us, you may choose to seek medical care at your doctor's  office, retail clinic, urgent care center, or emergency room.  If you have a medical emergency, please immediately call 911 or go to the emergency department.  Pager Numbers  - Dr. Kowalski: [phone]  - Dr. Moye: [phone]  - Dr. Stewart: [phone]  In the event of inclement weather, please call our main line at [phone] for an update on the status of any delays or closures.  Dermatology Medication Tips: Please keep the boxes that topical medications come in in order to help keep track of the instructions about where and how to use these. Pharmacies typically print the medication instructions only on the boxes and not directly on the medication tubes.   If your medication is too expensive, please contact our office at [phone] option 4 or send us a message through MyChart.   We are unable to tell what your co-pay for medications will be in advance as this is different depending on your insurance coverage. However, we may be able to find a substitute medication at lower cost or fill out paperwork to get insurance to cover a needed medication.   If a prior authorization is required to get your medication covered by your insurance company, please allow us 1-2 business days to complete this process.  Drug prices often vary depending on where the prescription is filled and some pharmacies may offer cheaper prices.  The website www.goodrx.com contains coupons for medications through different pharmacies. The prices here do not account for what the cost may be with help from insurance (it may be cheaper with your insurance), but the website can give you the price if you did not use any insurance.  - You can print the associated coupon and take it with your prescription to the pharmacy.  - You may also stop by our office during regular business hours and pick up a GoodRx coupon card.  - If you need your prescription sent electronically to a different pharmacy, notify our office  through [facility] MyChart or by phone at [phone] option 4.     Si Usted Necesita Algo Despus de Su Visita  Tambin puede enviarnos un mensaje a travs de MyChart. Por lo general respondemos a los mensajes de MyChart en el transcurso de 1 a 2 das hbiles.  Para renovar recetas, por favor pida a su farmacia que se ponga en contacto con nuestra oficina. Nuestro nmero de fax es el [phone].  Si tiene un asunto urgente cuando la clnica est cerrada y que no puede esperar hasta el siguiente da hbil, puede llamar/localizar a su doctor(a) al nmero que aparece a continuacin.   Por favor, tenga en cuenta que aunque hacemos todo lo posible para estar disponibles para asuntos urgentes fuera del horario de oficina, no estamos disponibles las 24 horas del da, los 7 das de la semana.   Si tiene un problema urgente y   no puede comunicarse con nosotros, puede optar por buscar atencin mdica  en el consultorio de su doctor(a), en una clnica privada, en un centro de atencin urgente o en una sala de emergencias.  Si tiene una emergencia mdica, por favor llame inmediatamente al 911 o vaya a la sala de emergencias.  Nmeros de bper  - Dr. Kowalski: [phone]  - Dra. Moye: [phone]  - Dra. Stewart: [phone]  En caso de inclemencias del tiempo, por favor llame a nuestra lnea principal al [phone] para una actualizacin sobre el estado de cualquier retraso o cierre.  Consejos para la medicacin en dermatologa: Por favor, guarde las cajas en las que vienen los medicamentos de uso tpico para ayudarle a seguir las instrucciones sobre dnde y cmo usarlos. Las farmacias generalmente imprimen las instrucciones del medicamento slo en las cajas y no directamente en los tubos del medicamento.   Si su medicamento es muy caro, por favor, pngase en contacto con nuestra oficina llamando al [phone] y presione la opcin 4 o envenos un mensaje a travs de MyChart.   No  podemos decirle cul ser su copago por los medicamentos por adelantado ya que esto es diferente dependiendo de la cobertura de su seguro. Sin embargo, es posible que podamos encontrar un medicamento sustituto a menor costo o llenar un formulario para que el seguro cubra el medicamento que se considera necesario.   Si se requiere una autorizacin previa para que su compaa de seguros cubra su medicamento, por favor permtanos de 1 a 2 das hbiles para completar este proceso.  Los precios de los medicamentos varan con frecuencia dependiendo del lugar de dnde se surte la receta y alguna farmacias pueden ofrecer precios ms baratos.  El sitio web www.goodrx.com tiene cupones para medicamentos de diferentes farmacias. Los precios aqu no tienen en cuenta lo que podra costar con la ayuda del seguro (puede ser ms barato con su seguro), pero el sitio web puede darle el precio si no utiliz ningn seguro.  - Puede imprimir el cupn correspondiente y llevarlo con su receta a la farmacia.  - Tambin puede pasar por nuestra oficina durante el horario de atencin regular y recoger una tarjeta de cupones de GoodRx.  - Si necesita que su receta se enve electrnicamente a una farmacia diferente, informe a nuestra oficina a travs de MyChart de [place] o por telfono llamando al [phone] y presione la opcin 4.  

## 2022-04-10 NOTE — Progress Notes (Signed)
Follow-Up Visit   Subjective  Teresa Estes is a 76 y.o. female who presents for the following: Annual Exam (HxBCC, HxDN). The patient presents for Total-Body Skin Exam (TBSE) for skin cancer screening and mole check.  The patient has spots, moles and lesions to be evaluated, some may be new or changing and the patient has concerns that these could be cancer.  The following portions of the chart were reviewed this encounter and updated as appropriate:  Tobacco  Allergies  Meds  Problems  Med Hx  Surg Hx  Fam Hx     Review of Systems: No other skin or systemic complaints except as noted in HPI or Assessment and Plan.  Objective  Well appearing patient in no apparent distress; mood and affect are within normal limits.  A full examination was performed including scalp, head, eyes, ears, nose, lips, neck, chest, axillae, abdomen, back, buttocks, bilateral upper extremities, bilateral lower extremities, hands, feet, fingers, toes, fingernails, and toenails. All findings within normal limits unless otherwise noted below.  Left Cheek/Zygomatic Area Erythematous keratotic or waxy stuck-on papule or plaque.  Chin and neck Erythematous papules and pustules  Right Breast, right axilla Lymphadenopathy at right axilla   Assessment & Plan   History of Basal Cell Carcinoma of the Skin - No evidence of recurrence today - Recommend regular full body skin exams - Recommend daily broad spectrum sunscreen SPF 30+ to sun-exposed areas, reapply every 2 hours as needed.  - Call if any new or changing lesions are noted between office visits  History of Dysplastic Nevi - No evidence of recurrence today - Recommend regular full body skin exams - Recommend daily broad spectrum sunscreen SPF 30+ to sun-exposed areas, reapply every 2 hours as needed.  - Call if any new or changing lesions are noted between office visits   Lentigines - Scattered tan macules - Due to sun exposure -  Benign-appearing, observe - Recommend daily broad spectrum sunscreen SPF 30+ to sun-exposed areas, reapply every 2 hours as needed. - Call for any changes  Seborrheic Keratoses - Stuck-on, waxy, tan-brown papules and/or plaques  - Benign-appearing - Discussed benign etiology and prognosis. - Observe - Call for any changes  Melanocytic Nevi - Tan-brown and/or pink-flesh-colored symmetric macules and papules - Benign appearing on exam today - Observation - Call clinic for new or changing moles - Recommend daily use of broad spectrum spf 30+ sunscreen to sun-exposed areas.   Hemangiomas - Red papules - Discussed benign nature - Observe - Call for any changes  Actinic Damage - Chronic condition, secondary to cumulative UV/sun exposure - diffuse scaly erythematous macules with underlying dyspigmentation - Recommend daily broad spectrum sunscreen SPF 30+ to sun-exposed areas, reapply every 2 hours as needed.  - Staying in the shade or wearing long sleeves, sun glasses (UVA+UVB protection) and wide brim hats (4-inch brim around the entire circumference of the hat) are also recommended for sun protection.  - Call for new or changing lesions.  Skin cancer screening performed today.  Inflamed seborrheic keratosis Left Cheek/Zygomatic Area Symptomatic, irritating, patient would like treated. Destruction of lesion - Left Cheek/Zygomatic Area Complexity: simple   Destruction method: cryotherapy   Informed consent: discussed and consent obtained   Timeout:  patient name, date of birth, surgical site, and procedure verified Lesion destroyed using liquid nitrogen: Yes   Region frozen until ice ball extended beyond lesion: Yes   Outcome: patient tolerated procedure well with no complications   Post-procedure details: wound  care instructions given   Additional details:  Prior to procedure, discussed risks of blister formation, small wound, skin dyspigmentation, or rare scar following  cryotherapy. Recommend Vaseline ointment to treated areas while healing.   Impetigo Chin and neck Start Mupirocin ointment twice daily.   mupirocin ointment (BACTROBAN) 2 % - Chin and neck Apply twice daily to affected areas on face and neck  History of breast cancer Right Breast, right axilla Keep scheduled appointment for needle aspiration biopsy.  Return in about 1 year (around 04/11/2023) for TBSE, HxBCCs, HxDN.  I, Emelia Salisbury, CMA, am acting as scribe for Sarina Ser, MD. Documentation: I have reviewed the above documentation for accuracy and completeness, and I agree with the above.  Sarina Ser, MD

## 2022-04-13 DIAGNOSIS — Z03818 Encounter for observation for suspected exposure to other biological agents ruled out: Secondary | ICD-10-CM | POA: Diagnosis not present

## 2022-04-13 DIAGNOSIS — J029 Acute pharyngitis, unspecified: Secondary | ICD-10-CM | POA: Diagnosis not present

## 2022-04-14 DIAGNOSIS — I10 Essential (primary) hypertension: Secondary | ICD-10-CM | POA: Diagnosis not present

## 2022-04-14 DIAGNOSIS — E7801 Familial hypercholesterolemia: Secondary | ICD-10-CM | POA: Diagnosis not present

## 2022-04-14 DIAGNOSIS — I700 Atherosclerosis of aorta: Secondary | ICD-10-CM | POA: Diagnosis not present

## 2022-04-14 DIAGNOSIS — M8580 Other specified disorders of bone density and structure, unspecified site: Secondary | ICD-10-CM | POA: Diagnosis not present

## 2022-04-14 DIAGNOSIS — F418 Other specified anxiety disorders: Secondary | ICD-10-CM | POA: Diagnosis not present

## 2022-04-15 ENCOUNTER — Encounter: Payer: Self-pay | Admitting: Dermatology

## 2022-04-25 ENCOUNTER — Ambulatory Visit
Admission: RE | Admit: 2022-04-25 | Discharge: 2022-04-25 | Disposition: A | Discharge status: Home / Self Care | Payer: Medicare PPO | Source: Ambulatory Visit | Attending: Family Medicine | Admitting: Family Medicine

## 2022-04-25 DIAGNOSIS — R928 Other abnormal and inconclusive findings on diagnostic imaging of breast: Secondary | ICD-10-CM

## 2022-04-25 DIAGNOSIS — R590 Localized enlarged lymph nodes: Secondary | ICD-10-CM | POA: Diagnosis not present

## 2022-04-25 DIAGNOSIS — R599 Enlarged lymph nodes, unspecified: Secondary | ICD-10-CM | POA: Diagnosis not present

## 2022-04-25 DIAGNOSIS — Z1231 Encounter for screening mammogram for malignant neoplasm of breast: Secondary | ICD-10-CM | POA: Insufficient documentation

## 2022-04-25 HISTORY — PX: BREAST BIOPSY: SHX20

## 2022-04-25 MED ORDER — LIDOCAINE-EPINEPHRINE 1 %-1:100000 IJ SOLN
8.0000 mL | Freq: Once | INTRAMUSCULAR | Status: AC
  Administered 2022-04-25: 8 mL

## 2022-04-25 MED ORDER — LIDOCAINE HCL (PF) 1 % IJ SOLN
3.0000 mL | Freq: Once | INTRAMUSCULAR | Status: AC
  Administered 2022-04-25: 3 mL

## 2022-04-26 ENCOUNTER — Encounter: Payer: Self-pay | Admitting: Diagnostic Radiology

## 2022-04-26 LAB — SURGICAL PATHOLOGY

## 2022-05-02 ENCOUNTER — Encounter: Payer: Self-pay | Admitting: Family Medicine

## 2022-05-02 DIAGNOSIS — H2513 Age-related nuclear cataract, bilateral: Secondary | ICD-10-CM | POA: Diagnosis not present

## 2022-05-03 ENCOUNTER — Other Ambulatory Visit: Payer: Self-pay | Admitting: Family Medicine

## 2022-05-03 DIAGNOSIS — R599 Enlarged lymph nodes, unspecified: Secondary | ICD-10-CM

## 2022-09-04 ENCOUNTER — Other Ambulatory Visit: Payer: Self-pay | Admitting: Family Medicine

## 2022-09-04 ENCOUNTER — Ambulatory Visit
Admission: RE | Admit: 2022-09-04 | Discharge: 2022-09-04 | Disposition: A | Discharge status: Home / Self Care | Payer: Medicare PPO | Source: Ambulatory Visit | Attending: Family Medicine | Admitting: Family Medicine

## 2022-09-04 DIAGNOSIS — Z853 Personal history of malignant neoplasm of breast: Secondary | ICD-10-CM | POA: Diagnosis not present

## 2022-09-04 DIAGNOSIS — R599 Enlarged lymph nodes, unspecified: Secondary | ICD-10-CM | POA: Diagnosis not present

## 2022-09-04 DIAGNOSIS — R590 Localized enlarged lymph nodes: Secondary | ICD-10-CM | POA: Diagnosis not present

## 2022-09-05 DIAGNOSIS — I10 Essential (primary) hypertension: Secondary | ICD-10-CM | POA: Diagnosis not present

## 2022-09-05 DIAGNOSIS — E7801 Familial hypercholesterolemia: Secondary | ICD-10-CM | POA: Diagnosis not present

## 2022-09-12 DIAGNOSIS — I700 Atherosclerosis of aorta: Secondary | ICD-10-CM | POA: Diagnosis not present

## 2022-09-12 DIAGNOSIS — Z0000 Encounter for general adult medical examination without abnormal findings: Secondary | ICD-10-CM | POA: Diagnosis not present

## 2022-09-12 DIAGNOSIS — Z853 Personal history of malignant neoplasm of breast: Secondary | ICD-10-CM | POA: Diagnosis not present

## 2022-09-12 DIAGNOSIS — M8580 Other specified disorders of bone density and structure, unspecified site: Secondary | ICD-10-CM | POA: Diagnosis not present

## 2022-09-12 DIAGNOSIS — R0789 Other chest pain: Secondary | ICD-10-CM | POA: Diagnosis not present

## 2022-09-12 DIAGNOSIS — Z1331 Encounter for screening for depression: Secondary | ICD-10-CM | POA: Diagnosis not present

## 2022-09-12 DIAGNOSIS — F418 Other specified anxiety disorders: Secondary | ICD-10-CM | POA: Diagnosis not present

## 2022-09-12 DIAGNOSIS — E7801 Familial hypercholesterolemia: Secondary | ICD-10-CM | POA: Diagnosis not present

## 2022-09-12 DIAGNOSIS — I10 Essential (primary) hypertension: Secondary | ICD-10-CM | POA: Diagnosis not present

## 2023-03-20 DIAGNOSIS — E7801 Familial hypercholesterolemia: Secondary | ICD-10-CM | POA: Diagnosis not present

## 2023-03-27 DIAGNOSIS — R0789 Other chest pain: Secondary | ICD-10-CM | POA: Diagnosis not present

## 2023-03-27 DIAGNOSIS — M8580 Other specified disorders of bone density and structure, unspecified site: Secondary | ICD-10-CM | POA: Diagnosis not present

## 2023-03-27 DIAGNOSIS — Z853 Personal history of malignant neoplasm of breast: Secondary | ICD-10-CM | POA: Diagnosis not present

## 2023-03-27 DIAGNOSIS — I700 Atherosclerosis of aorta: Secondary | ICD-10-CM | POA: Diagnosis not present

## 2023-03-27 DIAGNOSIS — F418 Other specified anxiety disorders: Secondary | ICD-10-CM | POA: Diagnosis not present

## 2023-03-27 DIAGNOSIS — E7801 Familial hypercholesterolemia: Secondary | ICD-10-CM | POA: Diagnosis not present

## 2023-03-27 DIAGNOSIS — I10 Essential (primary) hypertension: Secondary | ICD-10-CM | POA: Diagnosis not present

## 2023-03-27 DIAGNOSIS — N39490 Overflow incontinence: Secondary | ICD-10-CM | POA: Diagnosis not present

## 2023-03-28 ENCOUNTER — Other Ambulatory Visit: Payer: Self-pay | Admitting: Family Medicine

## 2023-03-28 DIAGNOSIS — Z1231 Encounter for screening mammogram for malignant neoplasm of breast: Secondary | ICD-10-CM

## 2023-04-19 ENCOUNTER — Ambulatory Visit: Payer: Medicare PPO | Admitting: Dermatology

## 2023-04-19 DIAGNOSIS — L814 Other melanin hyperpigmentation: Secondary | ICD-10-CM | POA: Diagnosis not present

## 2023-04-19 DIAGNOSIS — L820 Inflamed seborrheic keratosis: Secondary | ICD-10-CM | POA: Diagnosis not present

## 2023-04-19 DIAGNOSIS — W908XXA Exposure to other nonionizing radiation, initial encounter: Secondary | ICD-10-CM | POA: Diagnosis not present

## 2023-04-19 DIAGNOSIS — Z85828 Personal history of other malignant neoplasm of skin: Secondary | ICD-10-CM

## 2023-04-19 DIAGNOSIS — L918 Other hypertrophic disorders of the skin: Secondary | ICD-10-CM | POA: Diagnosis not present

## 2023-04-19 DIAGNOSIS — Z1283 Encounter for screening for malignant neoplasm of skin: Secondary | ICD-10-CM

## 2023-04-19 DIAGNOSIS — D229 Melanocytic nevi, unspecified: Secondary | ICD-10-CM

## 2023-04-19 DIAGNOSIS — I781 Nevus, non-neoplastic: Secondary | ICD-10-CM

## 2023-04-19 DIAGNOSIS — L578 Other skin changes due to chronic exposure to nonionizing radiation: Secondary | ICD-10-CM

## 2023-04-19 DIAGNOSIS — L821 Other seborrheic keratosis: Secondary | ICD-10-CM | POA: Diagnosis not present

## 2023-04-19 DIAGNOSIS — I8393 Asymptomatic varicose veins of bilateral lower extremities: Secondary | ICD-10-CM | POA: Diagnosis not present

## 2023-04-19 DIAGNOSIS — D1801 Hemangioma of skin and subcutaneous tissue: Secondary | ICD-10-CM

## 2023-04-19 DIAGNOSIS — Z86018 Personal history of other benign neoplasm: Secondary | ICD-10-CM

## 2023-04-19 DIAGNOSIS — L570 Actinic keratosis: Secondary | ICD-10-CM | POA: Diagnosis not present

## 2023-04-19 NOTE — Patient Instructions (Signed)

## 2023-04-19 NOTE — Progress Notes (Signed)
Follow-Up Visit   Subjective  Mirandah Slomski Hurney is a 77 y.o. female who presents for the following: Skin Cancer Screening and Full Body Skin Exam  The patient presents for Total-Body Skin Exam (TBSE) for skin cancer screening and mole check. The patient has spots, moles and lesions to be evaluated, some may be new or changing and the patient may have concern these could be cancer.  The following portions of the chart were reviewed this encounter and updated as appropriate: medications, allergies, medical history  Review of Systems:  No other skin or systemic complaints except as noted in HPI or Assessment and Plan.  Objective  Well appearing patient in no apparent distress; mood and affect are within normal limits.  A full examination was performed including scalp, head, eyes, ears, nose, lips, neck, chest, axillae, abdomen, back, buttocks, bilateral upper extremities, bilateral lower extremities, hands, feet, fingers, toes, fingernails, and toenails. All findings within normal limits unless otherwise noted below.   Relevant physical exam findings are noted in the Assessment and Plan.  Face x 3 (3) Erythematous thin papules/macules with gritty scale.   R nasal root x 1 Erythematous keratotic or waxy stuck-on papule or plaque.    Assessment & Plan   SKIN CANCER SCREENING PERFORMED TODAY.  ACTINIC DAMAGE - Chronic condition, secondary to cumulative UV/sun exposure - diffuse scaly erythematous macules with underlying dyspigmentation - Recommend daily broad spectrum sunscreen SPF 30+ to sun-exposed areas, reapply every 2 hours as needed.  - Staying in the shade or wearing long sleeves, sun glasses (UVA+UVB protection) and wide brim hats (4-inch brim around the entire circumference of the hat) are also recommended for sun protection.  - Call for new or changing lesions.  LENTIGINES, SEBORRHEIC KERATOSES, HEMANGIOMAS - Benign normal skin lesions - Benign-appearing - Call for any  changes  MELANOCYTIC NEVI - Tan-brown and/or pink-flesh-colored symmetric macules and papules - Benign appearing on exam today - Observation - Call clinic for new or changing moles - Recommend daily use of broad spectrum spf 30+ sunscreen to sun-exposed areas.   HISTORY OF BASAL CELL CARCINOMA OF THE SKIN - No evidence of recurrence today - Recommend regular full body skin exams - Recommend daily broad spectrum sunscreen SPF 30+ to sun-exposed areas, reapply every 2 hours as needed.  - Call if any new or changing lesions are noted between office visits  HISTORY OF DYSPLASTIC NEVI No evidence of recurrence today Recommend regular full body skin exams Recommend daily broad spectrum sunscreen SPF 30+ to sun-exposed areas, reapply every 2 hours as needed.  Call if any new or changing lesions are noted between office visits  Acrochordons (Skin Tags) - Fleshy, skin-colored pedunculated papules - Benign appearing.  - Observe. - If desired, they can be removed with an in office procedure that is not covered by insurance. - Please call the clinic if you notice any new or changing lesions.  AK (actinic keratosis) (3) Face x 3  Actinic keratoses are precancerous spots that appear secondary to cumulative UV radiation exposure/sun exposure over time. They are chronic with expected duration over 1 year. A portion of actinic keratoses will progress to squamous cell carcinoma of the skin. It is not possible to reliably predict which spots will progress to skin cancer and so treatment is recommended to prevent development of skin cancer.  Recommend daily broad spectrum sunscreen SPF 30+ to sun-exposed areas, reapply every 2 hours as needed.  Recommend staying in the shade or wearing long sleeves, sun  glasses (UVA+UVB protection) and wide brim hats (4-inch brim around the entire circumference of the hat). Call for new or changing lesions.   Destruction of lesion - Face x 3 (3) Complexity: simple    Destruction method: cryotherapy   Informed consent: discussed and consent obtained   Timeout:  patient name, date of birth, surgical site, and procedure verified Lesion destroyed using liquid nitrogen: Yes   Region frozen until ice ball extended beyond lesion: Yes   Outcome: patient tolerated procedure well with no complications   Post-procedure details: wound care instructions given    Inflamed seborrheic keratosis R nasal root x 1  Symptomatic, irritating, patient would like treated.  Destruction of lesion - R nasal root x 1 Complexity: simple   Destruction method: cryotherapy   Informed consent: discussed and consent obtained   Timeout:  patient name, date of birth, surgical site, and procedure verified Lesion destroyed using liquid nitrogen: Yes   Region frozen until ice ball extended beyond lesion: Yes   Outcome: patient tolerated procedure well with no complications   Post-procedure details: wound care instructions given    Varicose Veins/Spider Veins - Dilated blue, purple or red veins at the lower extremities - Reassured - Smaller vessels can be treated by sclerotherapy (a procedure to inject a medicine into the veins to make them disappear) if desired, but the treatment is not covered by insurance. Larger vessels may be covered if symptomatic and we would refer to vascular surgeon if treatment desired.  Return in about 1 year (around 04/18/2024) for TBSE.  Maylene Roes, CMA, am acting as scribe for Armida Sans, MD .   Documentation: I have reviewed the above documentation for accuracy and completeness, and I agree with the above.  Armida Sans, MD

## 2023-04-24 ENCOUNTER — Encounter: Payer: Self-pay | Admitting: Dermatology

## 2023-05-01 ENCOUNTER — Ambulatory Visit
Admission: RE | Admit: 2023-05-01 | Discharge: 2023-05-01 | Disposition: A | Discharge status: Home / Self Care | Payer: Medicare PPO | Source: Ambulatory Visit | Attending: Family Medicine | Admitting: Family Medicine

## 2023-05-01 DIAGNOSIS — Z1231 Encounter for screening mammogram for malignant neoplasm of breast: Secondary | ICD-10-CM | POA: Diagnosis not present

## 2023-05-08 DIAGNOSIS — H2513 Age-related nuclear cataract, bilateral: Secondary | ICD-10-CM | POA: Diagnosis not present

## 2023-05-08 DIAGNOSIS — Z0100 Encounter for examination of eyes and vision without abnormal findings: Secondary | ICD-10-CM | POA: Diagnosis not present

## 2023-05-08 DIAGNOSIS — H43813 Vitreous degeneration, bilateral: Secondary | ICD-10-CM | POA: Diagnosis not present

## 2023-09-25 DIAGNOSIS — E7801 Familial hypercholesterolemia: Secondary | ICD-10-CM | POA: Diagnosis not present

## 2023-09-25 DIAGNOSIS — I10 Essential (primary) hypertension: Secondary | ICD-10-CM | POA: Diagnosis not present

## 2023-10-02 DIAGNOSIS — M8580 Other specified disorders of bone density and structure, unspecified site: Secondary | ICD-10-CM | POA: Diagnosis not present

## 2023-10-02 DIAGNOSIS — I700 Atherosclerosis of aorta: Secondary | ICD-10-CM | POA: Diagnosis not present

## 2023-10-02 DIAGNOSIS — Z853 Personal history of malignant neoplasm of breast: Secondary | ICD-10-CM | POA: Diagnosis not present

## 2023-10-02 DIAGNOSIS — Z1331 Encounter for screening for depression: Secondary | ICD-10-CM | POA: Diagnosis not present

## 2023-10-02 DIAGNOSIS — I10 Essential (primary) hypertension: Secondary | ICD-10-CM | POA: Diagnosis not present

## 2023-10-02 DIAGNOSIS — R0789 Other chest pain: Secondary | ICD-10-CM | POA: Diagnosis not present

## 2023-10-02 DIAGNOSIS — F418 Other specified anxiety disorders: Secondary | ICD-10-CM | POA: Diagnosis not present

## 2023-10-02 DIAGNOSIS — Z0000 Encounter for general adult medical examination without abnormal findings: Secondary | ICD-10-CM | POA: Diagnosis not present

## 2023-10-02 DIAGNOSIS — E7801 Familial hypercholesterolemia: Secondary | ICD-10-CM | POA: Diagnosis not present

## 2023-12-18 DIAGNOSIS — I10 Essential (primary) hypertension: Secondary | ICD-10-CM | POA: Diagnosis not present

## 2023-12-18 DIAGNOSIS — I700 Atherosclerosis of aorta: Secondary | ICD-10-CM | POA: Diagnosis not present

## 2023-12-18 DIAGNOSIS — E7800 Pure hypercholesterolemia, unspecified: Secondary | ICD-10-CM | POA: Diagnosis not present

## 2023-12-18 DIAGNOSIS — M8580 Other specified disorders of bone density and structure, unspecified site: Secondary | ICD-10-CM | POA: Diagnosis not present

## 2023-12-18 DIAGNOSIS — F418 Other specified anxiety disorders: Secondary | ICD-10-CM | POA: Diagnosis not present

## 2024-03-21 ENCOUNTER — Other Ambulatory Visit: Payer: Self-pay | Admitting: Family Medicine

## 2024-03-21 DIAGNOSIS — Z1231 Encounter for screening mammogram for malignant neoplasm of breast: Secondary | ICD-10-CM

## 2024-04-01 DIAGNOSIS — E78019 Familial hypercholesterolemia, unspecified: Secondary | ICD-10-CM | POA: Diagnosis not present

## 2024-04-08 DIAGNOSIS — E78019 Familial hypercholesterolemia, unspecified: Secondary | ICD-10-CM | POA: Diagnosis not present

## 2024-04-08 DIAGNOSIS — R0789 Other chest pain: Secondary | ICD-10-CM | POA: Diagnosis not present

## 2024-04-08 DIAGNOSIS — I10 Essential (primary) hypertension: Secondary | ICD-10-CM | POA: Diagnosis not present

## 2024-04-08 DIAGNOSIS — I700 Atherosclerosis of aorta: Secondary | ICD-10-CM | POA: Diagnosis not present

## 2024-04-08 DIAGNOSIS — Z853 Personal history of malignant neoplasm of breast: Secondary | ICD-10-CM | POA: Diagnosis not present

## 2024-04-08 DIAGNOSIS — N39490 Overflow incontinence: Secondary | ICD-10-CM | POA: Diagnosis not present

## 2024-04-08 DIAGNOSIS — M8580 Other specified disorders of bone density and structure, unspecified site: Secondary | ICD-10-CM | POA: Diagnosis not present

## 2024-04-08 DIAGNOSIS — F418 Other specified anxiety disorders: Secondary | ICD-10-CM | POA: Diagnosis not present

## 2024-04-08 DIAGNOSIS — Z1331 Encounter for screening for depression: Secondary | ICD-10-CM | POA: Diagnosis not present

## 2024-04-29 ENCOUNTER — Encounter: Payer: Self-pay | Admitting: Dermatology

## 2024-04-29 ENCOUNTER — Ambulatory Visit: Admitting: Dermatology

## 2024-04-29 DIAGNOSIS — Z85828 Personal history of other malignant neoplasm of skin: Secondary | ICD-10-CM | POA: Diagnosis not present

## 2024-04-29 DIAGNOSIS — L237 Allergic contact dermatitis due to plants, except food: Secondary | ICD-10-CM | POA: Diagnosis not present

## 2024-04-29 DIAGNOSIS — L578 Other skin changes due to chronic exposure to nonionizing radiation: Secondary | ICD-10-CM | POA: Diagnosis not present

## 2024-04-29 DIAGNOSIS — Z1283 Encounter for screening for malignant neoplasm of skin: Secondary | ICD-10-CM | POA: Diagnosis not present

## 2024-04-29 DIAGNOSIS — I781 Nevus, non-neoplastic: Secondary | ICD-10-CM

## 2024-04-29 DIAGNOSIS — L821 Other seborrheic keratosis: Secondary | ICD-10-CM

## 2024-04-29 DIAGNOSIS — W908XXA Exposure to other nonionizing radiation, initial encounter: Secondary | ICD-10-CM

## 2024-04-29 DIAGNOSIS — L814 Other melanin hyperpigmentation: Secondary | ICD-10-CM | POA: Diagnosis not present

## 2024-04-29 DIAGNOSIS — L853 Xerosis cutis: Secondary | ICD-10-CM

## 2024-04-29 DIAGNOSIS — D1801 Hemangioma of skin and subcutaneous tissue: Secondary | ICD-10-CM | POA: Diagnosis not present

## 2024-04-29 DIAGNOSIS — Z86018 Personal history of other benign neoplasm: Secondary | ICD-10-CM

## 2024-04-29 DIAGNOSIS — D229 Melanocytic nevi, unspecified: Secondary | ICD-10-CM

## 2024-04-29 MED ORDER — TRIAMCINOLONE ACETONIDE 0.1 % EX CREA: TOPICAL_CREAM | CUTANEOUS | 0 refills | Status: AC

## 2024-04-29 NOTE — Progress Notes (Signed)
 Follow-Up Visit   Subjective  Teresa Estes is a 78 y.o. female who presents for the following: Skin Cancer Screening and Full Body Skin Exam  The patient presents for Total-Body Skin Exam (TBSE) for skin cancer screening and mole check. The patient has spots, moles and lesions to be evaluated, some may be new or changing and the patient may have concern these could be cancer. Hx of BCC and dysplastic nevi. Patient states she has broken out on the inner forearms,finger, and trunk that has been occurring for 2 weeks. She has been using calamine lotion and taking Claritin. Cedar tree had cut down and was helping cleanup and then began to breakout.  The following portions of the chart were reviewed this encounter and updated as appropriate: medications, allergies, medical history  Review of Systems:  No other skin or systemic complaints except as noted in HPI or Assessment and Plan.  Objective  Well appearing patient in no apparent distress; mood and affect are within normal limits.  A full examination was performed including scalp, head, eyes, ears, nose, lips, neck, chest, axillae, abdomen, back, buttocks, bilateral upper extremities, bilateral lower extremities, hands, feet, fingers, toes, fingernails, and toenails. All findings within normal limits unless otherwise noted below.   Relevant physical exam findings are noted in the Assessment and Plan.    Assessment & Plan   SKIN CANCER SCREENING PERFORMED TODAY.  ACTINIC DAMAGE - Chronic condition, secondary to cumulative UV/sun exposure - diffuse scaly erythematous macules with underlying dyspigmentation - Recommend daily broad spectrum sunscreen SPF 30+ to sun-exposed areas, reapply every 2 hours as needed.  - Staying in the shade or wearing long sleeves, sun glasses (UVA+UVB protection) and wide brim hats (4-inch brim around the entire circumference of the hat) are also recommended for sun protection.  - Call for new or  changing lesions.  LENTIGINES, SEBORRHEIC KERATOSES, HEMANGIOMAS - Benign normal skin lesions - Benign-appearing - Call for any changes  MELANOCYTIC NEVI - Tan-brown and/or pink-flesh-colored symmetric macules and papules - Benign appearing on exam today - Observation - Call clinic for new or changing moles - Recommend daily use of broad spectrum spf 30+ sunscreen to sun-exposed areas.  HISTORY OF BASAL CELL CARCINOMA OF THE SKIN - No evidence of recurrence today - Recommend regular full body skin exams - Recommend daily broad spectrum sunscreen SPF 30+ to sun-exposed areas, reapply every 2 hours as needed.  - Call if any new or changing lesions are noted between office visits   HISTORY OF DYSPLASTIC NEVI No evidence of recurrence today Recommend regular full body skin exams Recommend daily broad spectrum sunscreen SPF 30+ to sun-exposed areas, reapply every 2 hours as needed.  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 Avoid applying to face, groin, and axilla. Use as directed. Long-term use can cause thinning of the skin.  Patient advised to let us  know if not resolving with triamcinolone  and can send clobetasol cream.   Topical steroids (such as triamcinolone , fluocinolone, fluocinonide, mometasone, clobetasol, halobetasol, betamethasone, hydrocortisone) can cause thinning and lightening of the skin if they are used for too long in the same area. Your physician has selected the right strength medicine for your problem and area  affected on the body. Please use your medication only as directed by your physician to prevent side effects.    Varicose Veins/Spider Veins - Dilated blue, purple or red  veins at the lower extremities - Reassured - Smaller vessels can be treated by sclerotherapy (a procedure to inject a medicine into the veins to make them disappear) if desired, but the treatment is not covered by insurance. Larger vessels may be covered if symptomatic and we would refer to vascular surgeon if treatment desired.  MULTIPLE BENIGN NEVI   LENTIGINES   ACTINIC ELASTOSIS   SEBORRHEIC KERATOSES   CHERRY ANGIOMA   XEROSIS CUTIS   ALLERGIC CONTACT DERMATITIS DUE TO PLANT   SPIDER VEINS   Return in about 1 year (around 04/29/2025) for TBSE, HxBCC, HxDysplastic nevi, w/ Dr. Claudene.  IAlmetta Nora, RMA, am acting as scribe for Boneta Claudene, MD .   Documentation: I have reviewed the above documentation for accuracy and completeness, and I agree with the above.  Boneta Claudene, MD

## 2024-04-29 NOTE — Patient Instructions (Addendum)
 Start triamcinolone  0.1% cream twice a day until skin is smooth as needed to affected areas. Avoid applying to face, groin, and axilla. Use as directed. Long-term use can cause thinning of the skin.  Due to recent changes in healthcare laws, you may see results of your pathology and/or laboratory studies on MyChart before the doctors have had a chance to review them. We understand that in some cases there may be results that are confusing or concerning to you. Please understand that not all results are received at the same time and often the doctors may need to interpret multiple results in order to provide you with the best plan of care or course of treatment. Therefore, we ask that you please give us  2 business days to thoroughly review all your results before contacting the office for clarification. Should we see a critical lab result, you will be contacted sooner.   If You Need Anything After Your Visit  If you have any questions or concerns for your doctor, please call our main line at [phone] and press option 4 to reach your doctor's medical assistant. If no one answers, please leave a voicemail as directed and we will return your call as soon as possible. Messages left after 4 pm will be answered the following business day.   You may also send us  a message via MyChart. We typically respond to MyChart messages within 1-2 business days.  For prescription refills, please ask your pharmacy to contact our office. Our fax number is [phone].  If you have an urgent issue when the clinic is closed that cannot wait until the next business day, you can page your doctor at the number below.    Please note that while we do our best to be available for urgent issues outside of office hours, we are not available 24/7.   If you have an urgent issue and are unable to reach us , you may choose to seek medical care at your doctor's office, retail clinic, urgent care center, or emergency room.  If you  have a medical emergency, please immediately call 911 or go to the emergency department.  Pager Numbers  - Dr. Hester: [phone]  - Dr. Jackquline: [phone]  - Dr. Claudene: [phone]   - Dr. Raymund: [phone]  In the event of inclement weather, please call our main line at [phone] for an update on the status of any delays or closures.  Dermatology Medication Tips: Please keep the boxes that topical medications come in in order to help keep track of the instructions about where and how to use these. Pharmacies typically print the medication instructions only on the boxes and not directly on the medication tubes.   If your medication is too expensive, please contact our office at [phone] option 4 or send us  a message through MyChart.   We are unable to tell what your co-pay for medications will be in advance as this is different depending on your insurance coverage. However, we may be able to find a substitute medication at lower cost or fill out paperwork to get insurance to cover a needed medication.   If a prior authorization is required to get your medication covered by your insurance company, please allow us  1-2 business days to complete this process.  Drug prices often vary depending on where the prescription is filled and some pharmacies may offer cheaper prices.  The website www.goodrx.com contains coupons for medications through different pharmacies. The prices here do not account for what the  cost may be with help from insurance (it may be cheaper with your insurance), but the website can give you the price if you did not use any insurance.  - You can print the associated coupon and take it with your prescription to the pharmacy.  - You may also stop by our office during regular business hours and pick up a GoodRx coupon card.  - If you need your prescription sent electronically to a different pharmacy, notify our office through [facility] or by phone  at [phone] option 4.     Si Usted Necesita Algo Despus de Su Visita  Tambin puede enviarnos un mensaje a travs de Clinical Cytogeneticist. Por lo general respondemos a los mensajes de MyChart en el transcurso de 1 a 2 das hbiles.  Para renovar recetas, por favor pida a su farmacia que se ponga en contacto con nuestra oficina. Randi lakes de fax es [place] [phone].  Si tiene un asunto urgente cuando la clnica est cerrada y que no puede esperar hasta el siguiente da hbil, puede llamar/localizar a su doctor(a) al nmero que aparece a continuacin.   Por favor, tenga en cuenta que aunque hacemos todo lo posible para estar disponibles para asuntos urgentes fuera del horario de [place], no estamos disponibles las 24 horas del da, los 7 [address]  [address] de la [place].   Si tiene un problema urgente y no puede comunicarse con nosotros, puede optar por buscar atencin mdica  en el consultorio de su doctor(a), en una clnica privada, en un centro de atencin urgente o en una sala de emergencias.  Si tiene engineer, drilling, por favor llame inmediatamente al 911 o vaya a la sala de emergencias.  Nmeros de bper  - Dr. Hester: [phone]  - Dra. Jackquline: [phone]  - Dr. Claudene: [phone]  - Dra. Kitts: [phone]  En caso de inclemencias del [place], por favor llame a nuestra lnea principal al [phone] para una actualizacin sobre el estado de cualquier retraso o cierre.  Consejos para la medicacin en dermatologa: Por favor, guarde las cajas en las que vienen los medicamentos de uso tpico para ayudarle a seguir las instrucciones sobre dnde y cmo usarlos. Las farmacias generalmente imprimen las instrucciones del medicamento slo en las cajas y no directamente en los tubos del [place].   Si su medicamento es muy caro, por favor, pngase en contacto con landry rieger llamando al [phone] y presione la opcin 4 o envenos un mensaje a travs de Clinical Cytogeneticist.   No podemos  decirle cul ser su copago por los medicamentos por adelantado ya que esto es diferente dependiendo de la cobertura de su seguro. Sin embargo, es posible que podamos encontrar un medicamento sustituto a audiological scientist un formulario para que el seguro cubra el medicamento que se considera necesario.   Si se requiere una autorizacin previa para que su compaa de seguros cubra su medicamento, por favor permtanos de 1 a 2 das hbiles para completar este proceso.  Los precios de los medicamentos varan con frecuencia dependiendo del environmental consultant de dnde se surte la receta y alguna farmacias pueden ofrecer precios ms baratos.  El sitio web www.goodrx.com tiene cupones para medicamentos de health and safety inspector. Los precios aqu no tienen en cuenta lo que podra costar con la ayuda del seguro (puede ser ms barato con su seguro), pero el sitio web puede darle el precio si no utiliz tourist information centre manager.  - Puede imprimir el cupn correspondiente y llevarlo con su receta a la farmacia.  -  Tambin puede pasar por nuestra oficina durante el horario de atencin regular y education officer, museum una tarjeta de cupones de GoodRx.  - Si necesita que su receta se enve electrnicamente a una farmacia diferente, informe a nuestra oficina a travs de MyChart de [place] o por telfono llamando al [phone] y presione la opcin 4.

## 2024-04-30 ENCOUNTER — Ambulatory Visit: Payer: Medicare PPO | Admitting: Dermatology

## 2024-05-07 ENCOUNTER — Ambulatory Visit
Admission: RE | Admit: 2024-05-07 | Discharge: 2024-05-07 | Disposition: A | Discharge status: Home / Self Care | Source: Ambulatory Visit | Attending: Family Medicine | Admitting: Family Medicine

## 2024-05-07 DIAGNOSIS — Z1231 Encounter for screening mammogram for malignant neoplasm of breast: Secondary | ICD-10-CM | POA: Diagnosis not present

## 2025-05-05 ENCOUNTER — Ambulatory Visit: Admitting: Dermatology
